# Patient Record
Sex: Male | Born: 1980 | Race: White | Hispanic: No | Marital: Married | Smoking: Never smoker
Health system: Southern US, Community
[De-identification: ages and names within clinical notes are randomized; demographics above are authoritative.]

## PROBLEM LIST (undated history)

## (undated) DIAGNOSIS — N433 Hydrocele, unspecified: Secondary | ICD-10-CM

## (undated) DIAGNOSIS — F4310 Post-traumatic stress disorder, unspecified: Secondary | ICD-10-CM

## (undated) DIAGNOSIS — Z6372 Alcoholism and drug addiction in family: Secondary | ICD-10-CM

## (undated) DIAGNOSIS — S5400XA Injury of ulnar nerve at forearm level, unspecified arm, initial encounter: Secondary | ICD-10-CM

## (undated) DIAGNOSIS — L409 Psoriasis, unspecified: Secondary | ICD-10-CM

## (undated) DIAGNOSIS — L4050 Arthropathic psoriasis, unspecified: Secondary | ICD-10-CM

## (undated) HISTORY — DX: Alcoholism and drug addiction in family: Z63.72

## (undated) HISTORY — DX: Post-traumatic stress disorder, unspecified: F43.10

## (undated) HISTORY — DX: Arthropathic psoriasis, unspecified: L40.50

## (undated) HISTORY — DX: Psoriasis, unspecified: L40.9

## (undated) HISTORY — PX: MOUTH SURGERY: SHX715

## (undated) HISTORY — DX: Hydrocele, unspecified: N43.3

---
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
## 2006-03-03 HISTORY — PX: OTHER SURGICAL HISTORY: SHX169

## 2006-07-04 ENCOUNTER — Inpatient Hospital Stay (HOSPITAL_COMMUNITY): Admission: EM | Admit: 2006-07-04 | Discharge: 2006-07-06 | Payer: Self-pay | Admitting: Emergency Medicine

## 2008-07-26 ENCOUNTER — Emergency Department (HOSPITAL_COMMUNITY): Admission: EM | Admit: 2008-07-26 | Discharge: 2008-07-26 | Payer: Self-pay | Admitting: Emergency Medicine

## 2008-07-26 ENCOUNTER — Encounter (INDEPENDENT_AMBULATORY_CARE_PROVIDER_SITE_OTHER): Payer: Self-pay | Admitting: *Deleted

## 2008-08-21 ENCOUNTER — Encounter: Payer: Self-pay | Admitting: Gastroenterology

## 2008-09-27 ENCOUNTER — Ambulatory Visit: Payer: Self-pay | Admitting: Gastroenterology

## 2010-04-03 DIAGNOSIS — N433 Hydrocele, unspecified: Secondary | ICD-10-CM

## 2010-04-03 HISTORY — DX: Hydrocele, unspecified: N43.3

## 2010-04-12 ENCOUNTER — Encounter: Payer: Self-pay | Admitting: Family Medicine

## 2010-04-12 ENCOUNTER — Ambulatory Visit (INDEPENDENT_AMBULATORY_CARE_PROVIDER_SITE_OTHER): Payer: 59 | Admitting: Family Medicine

## 2010-04-12 DIAGNOSIS — N508 Other specified disorders of male genital organs: Secondary | ICD-10-CM

## 2010-04-12 DIAGNOSIS — L408 Other psoriasis: Secondary | ICD-10-CM | POA: Insufficient documentation

## 2010-04-15 ENCOUNTER — Encounter: Payer: Self-pay | Admitting: Family Medicine

## 2010-04-15 ENCOUNTER — Ambulatory Visit: Payer: Self-pay | Admitting: Family Medicine

## 2010-04-24 NOTE — Assessment & Plan Note (Signed)
Summary: new pt to est [name] Simpson   Vital Signs:  Patient profile:   30 year old male Height:      75 inches Weight:      178.75 pounds BMI:     22.42 Temp:     97.7 degrees F oral Pulse rate:   76 / minute Pulse rhythm:   regular BP sitting:   122 / 68  (left arm) Cuff size:   regular  Vitals Entered By: Delilah Shan CMA Duncan Dull) (April 12, 2010 10:09 AM) CC: New Patient to Establish   History of Present Illness: New patient, to est care.    Found "BB sized" lesions on testicle.  1 on each side.  No FCNAVD.  No other complaints.  Area not tender to palpation.  Noted the spots about 2-3 years ago. Minimal change in size over that time period.  The spots are mobile per patient.  No h/o STDs.  No dysuria.    Preventive Screening-Counseling & Management  Caffeine-Diet-Exercise     Does Patient Exercise: yes  Allergies (verified): No Known Drug Allergies  Past History:  Family History: Last updated: 04/12/2010 Family History of Colon Cancer:paternal grandmother Family History of Colon Polyps:paternal grandmother Family History of Diabetes: maternal grandfather and paternal grandmother Family History of Heart Disease: maternal grandparents and Paternal grandparents Family History of Inflammatory Bowel Disease:Mother Family History of Alcoholism/Addiction, parents Family History High cholesterol, grandparents Family History Hypertension, parents, grandparents Family History of Stroke, grandparents F alive, etoh M alive, HTN 1 brother, healthy  Social History: Last updated: 04/12/2010 Education:  Associates Single nonsmoker Illicit Drug Use - no Alcohol use-yes, rare Regular exercise-yes Former Electronics engineer, 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  Past Medical History: Current Problems:  PSORIASIS (ICD-696.1) per  FAMILY HISTORY OF ALCOHOLISM/ADDICTION (ICD-V61.41)    Past Surgical History: oral surgery at age 12 FB removed from R arm 2008 (h/o  ulnar nerve effects in hand- numbness)  Family History: Family History of Colon Cancer:paternal grandmother Family History of Colon Polyps:paternal grandmother Family History of Diabetes: maternal grandfather and paternal grandmother Family History of Heart Disease: maternal grandparents and Paternal grandparents Family History of Inflammatory Bowel Disease:Mother Family History of Alcoholism/Addiction, parents Family History High cholesterol, grandparents Family History Hypertension, parents, grandparents Family History of Stroke, grandparents F alive, etoh M alive, HTN 1 brother, healthy  Social History: Education:  Associates Single nonsmoker Illicit Drug Use - no Alcohol use-yes, rare Regular exercise-yes Former Electronics engineer, 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  Otherwise negative.    Physical Exam  General:  GEN: nad, alert and oriented HEENT: mucous membranes moist NECK: supple w/o LA CV: rrr.  no murmur PULM: ctab, no inc wob ABD: soft, +bs EXT: no edema SKIN: no acute rash, mild psoriatic changes noted on the extensor side of elbow Genitalia:  Testes bilaterally descended without nodularity, tenderness or masses except for small (a few mm in size) mass on R testicle, near the epididymis. No scrotal masses or lesions. No penis lesions or urethral discharge.   Impression & Recommendations:  Problem # 1:  TESTICULAR MASS (ICD-608.89) d/w patient.  ?small R upper epididymial cyst.  refer for u/s.  normal transillumination and no LA.  He understood, agreed.  follow up as needed.  Orders: Radiology Referral (Radiology)  Complete Medication List: 1)  Olux 0.05 % Foam (Clobetasol propionate) .... Apply  to skin two times a day 2)  Multivitamins Tabs (Multiple vitamin) .... Take 1 tablet by mouth once a day  Patient Instructions: 1)  See Shirlee Limerick about your referral before your leave today.   We'll let you know about the report when I have results.  Take care. Glad to see you.    Orders Added: 1)  New Patient Level III [04540] 2)  Radiology Referral [Radiology]   Immunization History:  Tetanus/Td Immunization History:    Tetanus/Td:  historical (03/04/2007)  Influenza Immunization History:    Influenza:  historical (12/01/2009)   Immunization History:  Tetanus/Td Immunization History:    Tetanus/Td:  Historical (03/04/2007)  Influenza Immunization History:    Influenza:  Historical (12/01/2009)  Current Allergies (reviewed today): No known allergies      Appended Document: new pt to est [name] Simpson Copy faxed to [facility].  [phone]

## 2010-06-11 LAB — HEPATITIS PANEL, ACUTE
Hep A IgM: NEGATIVE
Hep B C IgM: NEGATIVE
Hepatitis B Surface Ag: NEGATIVE

## 2010-06-11 LAB — HIV ANTIBODY (ROUTINE TESTING W REFLEX): HIV: NONREACTIVE
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
## 2011-03-06 ENCOUNTER — Other Ambulatory Visit: Payer: Self-pay | Admitting: Family Medicine

## 2011-03-06 DIAGNOSIS — Z83438 Family history of other disorder of lipoprotein metabolism and other lipidemia: Secondary | ICD-10-CM

## 2011-03-06 DIAGNOSIS — Z833 Family history of diabetes mellitus: Secondary | ICD-10-CM

## 2011-03-10 ENCOUNTER — Other Ambulatory Visit (INDEPENDENT_AMBULATORY_CARE_PROVIDER_SITE_OTHER): Payer: 59

## 2011-03-10 DIAGNOSIS — Z833 Family history of diabetes mellitus: Secondary | ICD-10-CM

## 2011-03-10 DIAGNOSIS — Z8349 Family history of other endocrine, nutritional and metabolic diseases: Secondary | ICD-10-CM

## 2011-03-10 DIAGNOSIS — Z83438 Family history of other disorder of lipoprotein metabolism and other lipidemia: Secondary | ICD-10-CM

## 2011-03-10 LAB — GLUCOSE, RANDOM: Glucose, Bld: 111 mg/dL — ABNORMAL HIGH (ref 70–99)

## 2011-03-10 LAB — LIPID PANEL: VLDL: 61.2 mg/dL — ABNORMAL HIGH (ref 0.0–40.0)

## 2011-03-10 LAB — LDL CHOLESTEROL, DIRECT: Direct LDL: 125.6 mg/dL

## 2011-03-14 ENCOUNTER — Encounter: Payer: Self-pay | Admitting: Family Medicine

## 2011-03-17 ENCOUNTER — Ambulatory Visit (INDEPENDENT_AMBULATORY_CARE_PROVIDER_SITE_OTHER): Payer: 59 | Admitting: Family Medicine

## 2011-03-17 ENCOUNTER — Encounter: Payer: Self-pay | Admitting: Family Medicine

## 2011-03-17 VITALS — BP 128/76 | HR 68 | Temp 98.1°F | Ht 73.5 in | Wt 179.2 lb

## 2011-03-17 DIAGNOSIS — Z0000 Encounter for general adult medical examination without abnormal findings: Secondary | ICD-10-CM

## 2011-03-17 DIAGNOSIS — R5383 Other fatigue: Secondary | ICD-10-CM

## 2011-03-17 DIAGNOSIS — R6889 Other general symptoms and signs: Secondary | ICD-10-CM

## 2011-03-17 DIAGNOSIS — R899 Unspecified abnormal finding in specimens from other organs, systems and tissues: Secondary | ICD-10-CM

## 2011-03-17 DIAGNOSIS — R5381 Other malaise: Secondary | ICD-10-CM

## 2011-03-17 NOTE — Patient Instructions (Addendum)
I would get a flu shot (not the flu mist) each fall.   Come back for fasting labs.  You can get your results through our phone system.  Follow the instructions on the blue card. Take care.  Glad to see you.

## 2011-03-17 NOTE — Progress Notes (Signed)
CPE- See plan.  Routine anticipatory guidance given to patient.  See health maintenance.  H/o small L hydrocele on the L testicle, prev u/s done, no sx currently.    Labs d/w pt and he isn't sure if he was fasting.  Will repeat.  See notes on labs.    Sex drive is decreased.  He has some dec in motivation (generally) recently.  He's working out, but not having as much motivation to work out.  Minimal alcohol. No illicits.  Mood okay, but slightly flat.  No SI/HI.  Going on for months.  He isn't sure what is going on with his symptoms.  No sig lifestyle changes.  Work is good for patient.  Fatigue.    PMH and SH reviewed  Meds, vitals, and allergies reviewed.   ROS: See HPI.  Otherwise negative.    GEN: nad, alert and oriented HEENT: mucous membranes moist NECK: supple w/o LA CV: rrr. PULM: ctab, no inc wob ABD: soft, +bs EXT: no edema SKIN: no acute rash

## 2011-03-18 ENCOUNTER — Other Ambulatory Visit (INDEPENDENT_AMBULATORY_CARE_PROVIDER_SITE_OTHER): Payer: 59

## 2011-03-18 DIAGNOSIS — R6889 Other general symptoms and signs: Secondary | ICD-10-CM

## 2011-03-18 DIAGNOSIS — R5383 Other fatigue: Secondary | ICD-10-CM

## 2011-03-18 DIAGNOSIS — R5381 Other malaise: Secondary | ICD-10-CM

## 2011-03-18 DIAGNOSIS — R899 Unspecified abnormal finding in specimens from other organs, systems and tissues: Secondary | ICD-10-CM

## 2011-03-18 LAB — CBC WITH DIFFERENTIAL/PLATELET
Basophils Relative: 0.3 % (ref 0.0–3.0)
Eosinophils Relative: 1.8 % (ref 0.0–5.0)
Hemoglobin: 15.4 g/dL (ref 13.0–17.0)
Lymphocytes Relative: 33.6 % (ref 12.0–46.0)
Monocytes Relative: 7.1 % (ref 3.0–12.0)
Neutro Abs: 3.2 10*3/uL (ref 1.4–7.7)
Neutrophils Relative %: 57.2 % (ref 43.0–77.0)
RBC: 4.78 Mil/uL (ref 4.22–5.81)
WBC: 5.5 10*3/uL (ref 4.5–10.5)

## 2011-03-18 LAB — LIPID PANEL
Cholesterol: 230 mg/dL — ABNORMAL HIGH (ref 0–200)
VLDL: 35.8 mg/dL (ref 0.0–40.0)

## 2011-03-18 LAB — COMPREHENSIVE METABOLIC PANEL
AST: 32 U/L (ref 0–37)
BUN: 18 mg/dL (ref 6–23)
Calcium: 9.1 mg/dL (ref 8.4–10.5)
Chloride: 103 mEq/L (ref 96–112)
Creatinine, Ser: 1 mg/dL (ref 0.4–1.5)
GFR: 100.13 mL/min (ref >60.00)
Glucose, Bld: 93 mg/dL (ref 70–99)

## 2011-03-18 LAB — LDL CHOLESTEROL, DIRECT: Direct LDL: 156.6 mg/dL

## 2011-03-20 ENCOUNTER — Telehealth: Payer: Self-pay | Admitting: Family Medicine

## 2011-03-20 DIAGNOSIS — E291 Testicular hypofunction: Secondary | ICD-10-CM

## 2011-03-20 LAB — TESTOSTERONE: Testosterone: 209.25 ng/dL — ABNORMAL LOW (ref 350.00–890.00)

## 2011-03-20 NOTE — Telephone Encounter (Signed)
Please call pt.  All labs are okay except for low T. I am not sure if this is related to the fatigue, but it would be worth talking to uro about this.  I put in the referral.  Thanks.

## 2011-03-21 ENCOUNTER — Encounter: Payer: Self-pay | Admitting: Family Medicine

## 2011-03-21 DIAGNOSIS — R5383 Other fatigue: Secondary | ICD-10-CM | POA: Insufficient documentation

## 2011-03-21 DIAGNOSIS — Z0000 Encounter for general adult medical examination without abnormal findings: Secondary | ICD-10-CM | POA: Insufficient documentation

## 2011-03-21 NOTE — Assessment & Plan Note (Signed)
Flu shot encouraged, healthy habits encouraged.  See notes on repeat labs.

## 2011-03-21 NOTE — Assessment & Plan Note (Signed)
With dec in sex drive.  Check labs, see notes on labs.

## 2011-03-21 NOTE — Telephone Encounter (Signed)
Patient advised.   Prefers a Insurance underwriter in [place].

## 2011-04-30 ENCOUNTER — Encounter: Payer: Self-pay | Admitting: Family Medicine

## 2011-04-30 DIAGNOSIS — E291 Testicular hypofunction: Secondary | ICD-10-CM | POA: Insufficient documentation

## 2011-09-16 ENCOUNTER — Emergency Department (HOSPITAL_COMMUNITY): Payer: Worker's Compensation

## 2011-09-16 ENCOUNTER — Encounter (HOSPITAL_COMMUNITY): Payer: Self-pay | Admitting: *Deleted

## 2011-09-16 ENCOUNTER — Emergency Department (HOSPITAL_COMMUNITY)
Admission: EM | Admit: 2011-09-16 | Discharge: 2011-09-16 | Disposition: A | Discharge status: Home / Self Care | Payer: Worker's Compensation | Attending: Emergency Medicine | Admitting: Emergency Medicine

## 2011-09-16 DIAGNOSIS — S99919A Unspecified injury of unspecified ankle, initial encounter: Secondary | ICD-10-CM | POA: Insufficient documentation

## 2011-09-16 DIAGNOSIS — Y990 Civilian activity done for income or pay: Secondary | ICD-10-CM | POA: Insufficient documentation

## 2011-09-16 DIAGNOSIS — S8980XA Other specified injuries of unspecified lower leg, initial encounter: Secondary | ICD-10-CM

## 2011-09-16 DIAGNOSIS — S8990XA Unspecified injury of unspecified lower leg, initial encounter: Secondary | ICD-10-CM | POA: Insufficient documentation

## 2011-09-16 DIAGNOSIS — X500XXA Overexertion from strenuous movement or load, initial encounter: Secondary | ICD-10-CM | POA: Insufficient documentation

## 2011-09-16 DIAGNOSIS — M25569 Pain in unspecified knee: Secondary | ICD-10-CM | POA: Insufficient documentation

## 2011-09-16 MED ORDER — HYDROCODONE-ACETAMINOPHEN 5-500 MG PO TABS: 1.0000 | ORAL_TABLET | Freq: Four times a day (QID) | ORAL | Status: AC | PRN
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
## 2011-09-16 NOTE — ED Notes (Signed)
Pt states he was walking down steps and felt right knee "hyper-extend" pt states he braced himself and did not fall. Pt reports increased pain with weight-bearing. No obvious deformities noted.

## 2011-09-17 NOTE — ED Provider Notes (Signed)
Medical screening examination/treatment/procedure(s) were performed by non-physician practitioner and as supervising physician I was immediately available for consultation/collaboration.   Hanley Seamen, MD 09/17/11 [phone]

## 2012-01-16 ENCOUNTER — Encounter: Payer: Self-pay | Admitting: Family Medicine

## 2012-01-16 ENCOUNTER — Ambulatory Visit (INDEPENDENT_AMBULATORY_CARE_PROVIDER_SITE_OTHER): Payer: Worker's Compensation | Admitting: Family Medicine

## 2012-01-16 VITALS — BP 118/68 | HR 76 | Temp 98.3°F | Ht 74.0 in | Wt 188.0 lb

## 2012-01-16 DIAGNOSIS — M25579 Pain in unspecified ankle and joints of unspecified foot: Secondary | ICD-10-CM

## 2012-01-16 NOTE — Patient Instructions (Addendum)
Keep using range of motion exercises for your ankle.  Take care.
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
## 2012-01-17 DIAGNOSIS — M25579 Pain in unspecified ankle and joints of unspecified foot: Secondary | ICD-10-CM | POA: Insufficient documentation

## 2012-01-17 NOTE — Assessment & Plan Note (Signed)
Chronic, episodically worse.  He has likely compensated for the prev knee injury.  It is as likely as not that Mr. [name] Simpson's right knee condition and/or his year of parachute jumps causes or contributes to his right ankle condition.  It would be reasonable for him to work on ROM exercises.  He needed a letter about his exam and this was provided.  He'll drop off a copy of his [facility] medical records.

## 2012-04-18 ENCOUNTER — Emergency Department (HOSPITAL_COMMUNITY)
Admission: EM | Admit: 2012-04-18 | Discharge: 2012-04-18 | Disposition: A | Discharge status: Home / Self Care | Payer: Worker's Compensation | Attending: Emergency Medicine | Admitting: Emergency Medicine

## 2012-04-18 ENCOUNTER — Emergency Department (HOSPITAL_COMMUNITY): Payer: Worker's Compensation

## 2012-04-18 ENCOUNTER — Encounter (HOSPITAL_COMMUNITY): Payer: Self-pay

## 2012-04-18 DIAGNOSIS — L210 Seborrhea capitis: Secondary | ICD-10-CM | POA: Insufficient documentation

## 2012-04-18 DIAGNOSIS — Y9229 Other specified public building as the place of occurrence of the external cause: Secondary | ICD-10-CM | POA: Insufficient documentation

## 2012-04-18 DIAGNOSIS — IMO0002 Reserved for concepts with insufficient information to code with codable children: Secondary | ICD-10-CM | POA: Insufficient documentation

## 2012-04-18 DIAGNOSIS — W19XXXA Unspecified fall, initial encounter: Secondary | ICD-10-CM | POA: Insufficient documentation

## 2012-04-18 DIAGNOSIS — S80219A Abrasion, unspecified knee, initial encounter: Secondary | ICD-10-CM

## 2012-04-18 DIAGNOSIS — S5400XA Injury of ulnar nerve at forearm level, unspecified arm, initial encounter: Secondary | ICD-10-CM | POA: Insufficient documentation

## 2012-04-18 DIAGNOSIS — Z79899 Other long term (current) drug therapy: Secondary | ICD-10-CM | POA: Insufficient documentation

## 2012-04-18 DIAGNOSIS — Y990 Civilian activity done for income or pay: Secondary | ICD-10-CM | POA: Insufficient documentation

## 2012-04-18 DIAGNOSIS — S8000XA Contusion of unspecified knee, initial encounter: Secondary | ICD-10-CM | POA: Insufficient documentation

## 2012-04-18 DIAGNOSIS — Z87448 Personal history of other diseases of urinary system: Secondary | ICD-10-CM | POA: Insufficient documentation

## 2012-04-18 HISTORY — DX: Injury of ulnar nerve at forearm level, unspecified arm, initial encounter: S54.00XA

## 2012-04-18 MED ORDER — IBUPROFEN 800 MG PO TABS
800.0000 mg | ORAL_TABLET | Freq: Once | ORAL | Status: AC
  Administered 2012-04-18: 800 mg via ORAL
  Filled 2012-04-18: qty 1

## 2012-04-18 NOTE — ED Provider Notes (Signed)
Medical screening examination/treatment/procedure(s) were performed by non-physician practitioner and as supervising physician I was immediately available for consultation/collaboration.  Annalycia Done M Halvor Behrend, MD 04/18/12 0431 

## 2012-04-18 NOTE — ED Notes (Signed)
Patient transported to X-ray 
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
## 2014-01-23 ENCOUNTER — Encounter: Payer: Self-pay | Admitting: Family Medicine

## 2014-01-23 ENCOUNTER — Ambulatory Visit (INDEPENDENT_AMBULATORY_CARE_PROVIDER_SITE_OTHER): Payer: 59 | Admitting: Family Medicine

## 2014-01-23 VITALS — BP 118/78 | HR 64 | Temp 98.5°F | Wt 182.0 lb

## 2014-01-23 DIAGNOSIS — M542 Cervicalgia: Secondary | ICD-10-CM

## 2014-01-23 MED ORDER — CYCLOBENZAPRINE HCL 10 MG PO TABS: 5.0000 mg | ORAL_TABLET | Freq: Three times a day (TID) | ORAL | Status: DC | PRN

## 2014-01-23 NOTE — Progress Notes (Signed)
Pre visit review using our clinic review tool, if applicable. No additional management support is needed unless otherwise documented below in the visit note.  Neck pain.  "I tend to pop my neck but I stopped doing that."  R sided posterior neck pain.  Not midline or L sided pain.  Causing HA, R sided occipital.   No rash.  No specific trauma.  No arm sx.  No dysphagia.  Going on for about 3 months, noted most days, some days better than others.   More pain with neck ROM, tilting or turning head to the right.   Tried ibuprofen with a little relief, no fully resolved.   Not a stiff neck.    Meds, vitals, and allergies reviewed.   ROS: See HPI.  Otherwise, noncontributory.  nad ncat Neck supple, no LA No midline pain, slightly ttp/tight on the R paraspinal muscles No rash rrr ctab Normal S/S ext x4

## 2014-01-23 NOTE — Patient Instructions (Signed)
Heat, ibuprofen with food, flexeril (sedation caution) and notify me if not better.  Take care.  Glad to see you.

## 2014-01-25 DIAGNOSIS — M542 Cervicalgia: Secondary | ICD-10-CM | POA: Insufficient documentation

## 2014-01-25 NOTE — Assessment & Plan Note (Signed)
Likely muscle tightness/spasm.  No need to image today.  Flexeril with sedation caution.  Heat, stretching, nsaid cautions.  He agrees f/u prn.  Also he had flu mist about 1 month ago.  Chart updated.

## 2015-07-02 ENCOUNTER — Encounter: Payer: Self-pay | Admitting: Family Medicine

## 2015-07-02 ENCOUNTER — Ambulatory Visit (INDEPENDENT_AMBULATORY_CARE_PROVIDER_SITE_OTHER)
Admission: RE | Admit: 2015-07-02 | Discharge: 2015-07-02 | Disposition: A | Discharge status: Home / Self Care | Payer: 59 | Source: Ambulatory Visit | Attending: Family Medicine | Admitting: Family Medicine

## 2015-07-02 ENCOUNTER — Ambulatory Visit (INDEPENDENT_AMBULATORY_CARE_PROVIDER_SITE_OTHER): Payer: 59 | Admitting: Family Medicine

## 2015-07-02 VITALS — BP 112/74 | HR 81 | Temp 98.5°F | Wt 183.0 lb

## 2015-07-02 DIAGNOSIS — M546 Pain in thoracic spine: Secondary | ICD-10-CM | POA: Diagnosis not present

## 2015-07-02 DIAGNOSIS — L4050 Arthropathic psoriasis, unspecified: Secondary | ICD-10-CM | POA: Diagnosis not present

## 2015-07-02 DIAGNOSIS — R197 Diarrhea, unspecified: Secondary | ICD-10-CM | POA: Diagnosis not present

## 2015-07-02 DIAGNOSIS — M549 Dorsalgia, unspecified: Secondary | ICD-10-CM | POA: Insufficient documentation

## 2015-07-02 MED ORDER — CYCLOBENZAPRINE HCL 10 MG PO TABS: 5.0000 mg | ORAL_TABLET | Freq: Three times a day (TID) | ORAL | Status: DC | PRN

## 2015-07-02 NOTE — Assessment & Plan Note (Signed)
No radicular sx, pred didn't help.  Check basic films today and use flexeril to see if that help in the meantime, as this could be chronic muscle spasm.  D/w pt.  He agrees.  Sedation caution.

## 2015-07-02 NOTE — Assessment & Plan Note (Signed)
dw pt.  Sounds more typical for IBS.  Refer to GI.  He agrees. >25 minutes spent in face to face time with patient, >50% spent in counselling or coordination of care.
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
## 2015-07-02 NOTE — Patient Instructions (Signed)
[name] will call about your referral.  See her on the way out.  Go to the lab on the way out.  We'll contact you with your lab report. Don't change your meds for now.

## 2016-01-26 ENCOUNTER — Emergency Department (HOSPITAL_COMMUNITY): Payer: 59

## 2016-01-26 ENCOUNTER — Encounter (HOSPITAL_COMMUNITY): Payer: Self-pay | Admitting: Family Medicine

## 2016-01-26 ENCOUNTER — Emergency Department (HOSPITAL_COMMUNITY)
Admission: EM | Admit: 2016-01-26 | Discharge: 2016-01-26 | Disposition: A | Discharge status: Home / Self Care | Payer: 59 | Attending: Emergency Medicine | Admitting: Emergency Medicine

## 2016-01-26 DIAGNOSIS — R202 Paresthesia of skin: Secondary | ICD-10-CM | POA: Diagnosis not present

## 2016-01-26 DIAGNOSIS — R791 Abnormal coagulation profile: Secondary | ICD-10-CM | POA: Insufficient documentation

## 2016-01-26 DIAGNOSIS — R200 Anesthesia of skin: Secondary | ICD-10-CM | POA: Diagnosis not present

## 2016-01-26 DIAGNOSIS — R29810 Facial weakness: Secondary | ICD-10-CM | POA: Diagnosis present

## 2016-01-26 DIAGNOSIS — Z79899 Other long term (current) drug therapy: Secondary | ICD-10-CM | POA: Insufficient documentation

## 2016-01-26 LAB — PROTIME-INR
INR: 0.93
PROTHROMBIN TIME: 12.4 s (ref 11.4–15.2)

## 2016-01-26 LAB — DIFFERENTIAL
Basophils Absolute: 0 10*3/uL (ref 0.0–0.1)
Basophils Relative: 0 %
EOS ABS: 0.1 10*3/uL (ref 0.0–0.7)
EOS PCT: 2 %
LYMPHS PCT: 38 %
Lymphs Abs: 2.3 10*3/uL (ref 0.7–4.0)
MONO ABS: 0.6 10*3/uL (ref 0.1–1.0)
Monocytes Relative: 10 %
NEUTROS PCT: 50 %
Neutro Abs: 3.2 10*3/uL (ref 1.7–7.7)

## 2016-01-26 LAB — CBC
HCT: 43.1 % (ref 39.0–52.0)
Hemoglobin: 15 g/dL (ref 13.0–17.0)
MCH: 30.8 pg (ref 26.0–34.0)
MCHC: 34.8 g/dL (ref 30.0–36.0)
MCV: 88.5 fL (ref 78.0–100.0)
Platelets: 165 K/uL (ref 150–400)
RBC: 4.87 MIL/uL (ref 4.22–5.81)
RDW: 12.1 % (ref 11.5–15.5)
WBC: 6.2 K/uL (ref 4.0–10.5)

## 2016-01-26 LAB — COMPREHENSIVE METABOLIC PANEL
ALBUMIN: 4.8 g/dL (ref 3.5–5.0)
ALK PHOS: 61 U/L (ref 38–126)
ALT: 33 U/L (ref 17–63)
ANION GAP: 9 (ref 5–15)
AST: 29 U/L (ref 15–41)
BILIRUBIN TOTAL: 0.9 mg/dL (ref 0.3–1.2)
BUN: 17 mg/dL (ref 6–20)
CALCIUM: 9.4 mg/dL (ref 8.9–10.3)
CO2: 25 mmol/L (ref 22–32)
Chloride: 105 mmol/L (ref 101–111)
Creatinine, Ser: 0.97 mg/dL (ref 0.61–1.24)
GFR calc non Af Amer: >60 mL/min (ref >60)
GLUCOSE: 91 mg/dL (ref 65–99)
POTASSIUM: 4 mmol/L (ref 3.5–5.1)
SODIUM: 139 mmol/L (ref 135–145)
TOTAL PROTEIN: 8 g/dL (ref 6.5–8.1)

## 2016-01-26 LAB — I-STAT CHEM 8, ED
BUN: 19 mg/dL (ref 6–20)
Calcium, Ion: 1.18 mmol/L (ref 1.15–1.40)
Chloride: 104 mmol/L (ref 101–111)
Creatinine, Ser: 1 mg/dL (ref 0.61–1.24)
Glucose, Bld: 93 mg/dL (ref 65–99)
HCT: 46 % (ref 39.0–52.0)
Hemoglobin: 15.6 g/dL (ref 13.0–17.0)
Potassium: 4.2 mmol/L (ref 3.5–5.1)
Sodium: 140 mmol/L (ref 135–145)
TCO2: 27 mmol/L (ref 0–100)

## 2016-01-26 LAB — I-STAT TROPONIN, ED: Troponin i, poc: 0 ng/mL (ref 0.00–0.08)

## 2016-01-26 LAB — APTT: aPTT: 28 seconds (ref 24–36)

## 2016-01-26 MED ORDER — GADOBENATE DIMEGLUMINE 529 MG/ML IV SOLN
17.0000 mL | Freq: Once | INTRAVENOUS | Status: AC | PRN
  Administered 2016-01-26: 17 mL via INTRAVENOUS

## 2016-01-26 NOTE — Consult Note (Addendum)
Neurology Consultation Reason for Consult: transient left sided paresthesia Referring Physician: Alric RanGoldston, S  CC: transient left sided paresthesia  History is obtained from: patient, wife.   HPI: [name] Simpson is a 35 y.o. male Presents following an episode that occurred around 4:30 PM.His wife reports that he approached her and tried to say "help me" But he reports that the words just would not come out he felt numbness and tingling of his left arm and face. He felt like he may have been slightly confused during this episode. Following this, the symptoms gradually improved to the point that he is now nearing baseline.   He also complains of some dizziness which started last night, but was relatively mild. This morning the dizziness was present and persistent. This has lasted throughout the day and is still present.   He does not have any severe headache, but does state that the backs of his eyes feel "sore" bilaterally and he does note that light is bothering him some.    He does occasionally get headaches, But not definitely migrainous in  [place]. He does have a brother who gets migraines.  LKW: 11/24 prior to bed tpa given?: no, Out of window    ROS: A 14 point ROS was performed and is negative except as noted in the HPI.   Past Medical History:  Diagnosis Date  . Alcoholism in family   . Hydrocele, left 04/2010   Seen on testicular US, small  . Psoriasis    per Dr. Terri PiedraLupton  . Psoriatic arthritis (HCC)    per Dr. Kellie Simmeringruslow  . Ulnar nerve injury      Family History  Problem Relation Age of Onset  . Irritable bowel syndrome Mother   . Hypertension Mother   . Alcohol abuse Father   . Cirrhosis Father   . Cancer Paternal Grandmother     colon CA, colon polyps  . Diabetes Paternal Grandmother   . Diabetes Maternal Grandfather   . Alcohol abuse Other   . Hyperlipidemia Other   . Hypertension Other   . Heart disease Other   . Hypertension Other   . Stroke Other   .  Prostate cancer Neg Hx   Brother-migraine   Social History:  reports that he has never smoked. He has never used smokeless tobacco. He reports that he drinks alcohol. He reports that he does not use drugs.   Exam: Current vital signs: BP 128/79   Pulse 79   Temp 98 F (36.7 C) (Oral)   Resp 17   Ht 6\' 3"  (1.905 m)   Wt 83.9 kg (185 lb)   SpO2 99%   BMI 23.12 kg/m  Vital signs in last 24 hours: Temp:  [98 F (36.7 C)] 98 F (36.7 C) (11/25 1740) Pulse Rate:  [74-86] 79 (11/25 1900) Resp:  [17-20] 17 (11/25 1900) BP: (128-147)/(79-93) 128/79 (11/25 1900) SpO2:  [98 %-99 %] 99 % (11/25 1900) Weight:  [83.9 kg (185 lb)] 83.9 kg (185 lb) (11/25 1742)   Physical Exam  Constitutional: Appears well-developed and well-nourished.  Psych: Affect appropriate to situation Eyes: No scleral injection HENT: No OP obstrucion Head: Normocephalic.  Cardiovascular: Normal rate and regular rhythm.  Respiratory: Effort normal and breath sounds normal to anterior ascultation GI: Soft.  No distension. There is no tenderness.  Skin: WDI  Neuro: Mental Status: Patient is awake, alert, oriented to person, place, month, year, and situation. Patient is able to give a clear and coherent history. No signs  of aphasia or neglect Cranial Nerves: II: Visual Fields are full. Pupils are equal, round, and reactive to light.   III,IV, VI: EOMI without ptosis or diploplia.  V: Facial sensation is symmetric to temperature VII: Facial movement is symmetric.  VIII: hearing is intact to voice X: Uvula elevates symmetrically XI: Shoulder shrug is symmetric. XII: tongue is midline without atrophy or fasciculations.  Motor: Tone is normal. Bulk is normal. 5/5 strength was present in all four extremities.  Sensory: Sensation is symmetric to light touch and temperature in the arms and legs. Deep Tendon Reflexes: 2+ and symmetric in the biceps and patellae.  Plantars: Toes are downgoing bilaterally.   Cerebellar: FNF intact bilaterally  I have reviewed labs in epic and the results pertinent to this consultation are: CBC, BMP-unremarkable  I have reviewed the images obtained:CT head-negative  Impression: 35 year old male with transient left-sided paresthesias, difficulty speaking in the setting of mild retro-orbital discomfort and photophobia. Given the nature of his deficits, I do think that A vascular etiology needs to be ruled out with MRI. With persistent symptoms since this AM, I would think that MRI would likely be positive if this was a vascular cause. Also, with positive symptoms(paresthesia) , this is more common with migraine aura than ischemia. That being siad, he does not have a definite history of migraine.   Therefore, I would further investigate with MRI/MRA. If there is abnormality on either that could be related to symptoms then I would suggest pursuing that.   Recommendations: 1) MRI brain, MRA head and neck.  2) If her were to have recurrent symptoms, I would have him return to the ER  Ritta SlotMcNeill Lawton Dollinger, MD [facility] [phone]  If 7pm- 7am, please page neurology on call as listed in AMION.

## 2016-01-26 NOTE — ED Notes (Signed)
Pt remains in MRI with Riki RuskJeremy, NT.

## 2016-01-26 NOTE — ED Notes (Signed)
Pt in MRI with NT.

## 2016-01-26 NOTE — ED Notes (Signed)
ED Provider at bedside. EDP [place] 

## 2016-01-26 NOTE — ED Triage Notes (Signed)
Patient started experiencing dizziness that started 20:00 last night. THen, at 16:50 today, he developed left sided facial droop and altered mental status. Pt reports he wanted to ask his spouse for assistance but physically could not speak the words. No improvement of dizziness. Slight improvement with facial droop but pt still struggles with talking. Feels like his tongue is thick.

## 2016-01-26 NOTE — ED Provider Notes (Signed)
[facility]-EMERGENCY DEPT Provider Note   CSN: [id number] Arrival date & time: 01/26/16  1721   An emergency department physician performed an initial assessment on this suspected stroke patient at 1750.  History   Chief Complaint Chief Complaint  Patient presents with  . Facial Droop  . Altered Mental Status    HPI [name] Simpson is a 35 y.o. male.  HPI  35 year old male presents with acute left facial numbness, droop, and slurred speech. History taken from patient and significant other. Last night at around 10 PM he started feeling dizzy with blurred vision. He describes this as a smudged vision. It comes and goes. He's not really dizzy as much as he is off balance. No feeling like he's going to pass out. On and off since last night into today. At around 4:45 PM he all of a sudden had trouble speaking and could not get his words out. His tongue felt numb. His left face was drooping. Brought in here and his symptoms are significantly improved but not gone. He also has felt left arm numbness and tingling since then. Denies headache, neck pain. His chest felt tight when he had the slurred speech but he thinks that was anxiety and has resolved.  Past Medical History:  Diagnosis Date  . Alcoholism in family   . Hydrocele, left 04/2010   Seen on testicular US, small  . Psoriasis    per Dr. Terri PiedraLupton  . Psoriatic arthritis (HCC)    per Dr. Kellie Simmeringruslow  . Ulnar nerve injury     Patient Active Problem List   Diagnosis Date Noted  . Back pain 07/02/2015  . Psoriatic arthritis (HCC) 07/02/2015  . Diarrhea 07/02/2015  . Neck pain 01/25/2014  . Ankle pain 01/17/2012  . Hypogonadism male 04/30/2011  . Routine general medical examination at a health care facility 03/21/2011  . Fatigue 03/21/2011  . PSORIASIS 04/12/2010    Past Surgical History:  Procedure Laterality Date  . FB removed, right arm  2008   h/o ulnar nerve effects in hand - numbness  . MOUTH SURGERY  Age 35       Home  Medications    Prior to Admission medications   Medication Sig Start Date End Date Taking? Authorizing Provider  saccharomyces boulardii (FLORASTOR) 250 MG capsule Take 250 mg by mouth 2 (two) times daily.   Yes Historical Provider, MD    Family History Family History  Problem Relation Age of Onset  . Irritable bowel syndrome Mother   . Hypertension Mother   . Alcohol abuse Father   . Cirrhosis Father   . Cancer Paternal Grandmother     colon CA, colon polyps  . Diabetes Paternal Grandmother   . Diabetes Maternal Grandfather   . Alcohol abuse Other   . Hyperlipidemia Other   . Hypertension Other   . Heart disease Other   . Hypertension Other   . Stroke Other   . Prostate cancer Neg Hx     Social History Social History  Substance Use Topics  . Smoking status: Never Smoker  . Smokeless tobacco: Never Used  . Alcohol use 0.0 oz/week     Comment: Once every 3 months     Allergies   Patient has no known allergies.   Review of Systems Review of Systems  Constitutional: Negative for fever.  Eyes: Positive for visual disturbance. Negative for photophobia and pain.  Respiratory: Positive for chest tightness. Negative for shortness of breath.   Gastrointestinal: Negative for nausea  and vomiting.  Musculoskeletal: Negative for neck pain and neck stiffness.  Neurological: Positive for dizziness, speech difficulty, weakness (facial) and numbness (left arm). Negative for headaches.  All other systems reviewed and are negative.    Physical Exam Updated Vital Signs BP 124/79 (BP Location: Left Arm)   Pulse 83   Temp 98 F (36.7 C) (Oral)   Resp 18   Ht 6\' 3"  (1.905 m)   Wt 185 lb (83.9 kg)   SpO2 98%   BMI 23.12 kg/m   Physical Exam  Constitutional: He is oriented to person, place, and time. He appears well-developed and well-nourished.  HENT:  Head: Normocephalic and atraumatic.  Right Ear: External ear normal.  Left Ear: External ear normal.  Nose: Nose  normal.  Eyes: Right eye exhibits no discharge. Left eye exhibits no discharge.  Neck: Neck supple.  Cardiovascular: Normal rate, regular rhythm and normal heart sounds.   Pulmonary/Chest: Effort normal and breath sounds normal.  Abdominal: Soft. There is no tenderness.  Musculoskeletal: He exhibits no edema.  Neurological: He is alert and oriented to person, place, and time.  CN 3-12 grossly intact. 5/5 strength in all 4 extremities. Grossly normal sensation except for slightly decreased sensation diffusely in left arm. Normal finger to nose.   Skin: Skin is warm and dry.  Nursing note and vitals reviewed.    ED Treatments / Results  Labs (all labs ordered are listed, but only abnormal results are displayed) Labs Reviewed  PROTIME-INR  APTT  CBC  DIFFERENTIAL  COMPREHENSIVE METABOLIC PANEL  I-STAT TROPOININ, ED  CBG MONITORING, ED  I-STAT CHEM 8, ED    EKG  EKG Interpretation  Date/Time:  Saturday January 26 2016 17:39:25 EST Ventricular Rate:  72 PR Interval:    QRS Duration: 86 QT Interval:  358 QTC Calculation: 392 R Axis:   88 Text Interpretation:  Sinus rhythm RSR' in V1 or V2, right VCD or RVH no acute ST/T changes No old tracing to compare Confirmed by Chakara Bognar MD, Zaevion Parke [phone]) on 01/26/2016 10:00:23 PM       Radiology Mr Maxine Glenn Head Wo Contrast  Result Date: 01/26/2016 CLINICAL DATA:  Acute left-sided weakness EXAM: MRI HEAD WITHOUT CONTRAST MRA HEAD WITHOUT CONTRAST MRA NECK WITH AND WITHOUT CONTRAST TECHNIQUE: Multiplanar, multiecho pulse sequences of the brain and surrounding structures were obtained without intravenous contrast. Angiographic images of the Circle of Willis were obtained using MRA technique without intravenous contrast. Angiographic images of the neck were obtained using MRA technique with and without intravenous contrast. Carotid stenosis measurements (when applicable) are obtained utilizing NASCET criteria, using the distal internal carotid  diameter as the denominator. COMPARISON:  None. FINDINGS: MRI HEAD FINDINGS Brain: No acute infarct or intraparenchymal hemorrhage. The midline structures are normal. No focal parenchymal signal abnormality. No mass lesion or midline shift. No hydrocephalus or extra-axial fluid collection. No age advanced or lobar predominant atrophy. Vascular: Major intracranial arterial and venous sinus flow voids are preserved. No evidence of chronic microhemorrhage or amyloid angiopathy. Skull and upper cervical spine: The visualized skull base, calvarium, upper cervical spine and extracranial soft tissues are normal. Sinuses/Orbits: No fluid levels or advanced mucosal thickening. No mastoid effusion. Normal orbits. MRA HEAD FINDINGS Intracranial internal carotid arteries: There is a small outpouching from the lateral aspect of the cavernous segment of the left internal carotid artery, measuring 2 mm base to apex and 2 mm at its base. Anterior cerebral arteries: Normal. Middle cerebral arteries: Normal. Posterior communicating arteries: Absent bilaterally.  Posterior cerebral arteries: Normal. Basilar artery: Normal. Vertebral arteries: Left dominant. Normal. Superior cerebellar arteries: Normal. Anterior inferior cerebellar arteries: Normal. Posterior inferior cerebellar arteries: Normal. MRA NECK FINDINGS Aortic arch: There is a normal 3 vessel branching pattern. The proximal subclavian arteries are normal. No aneurysm or dissection. Right carotid system:  Normal Left carotid system:  Normal Vertebral arteries: Both vertebral artery origins are widely patent. The cervical segments of both vertebral arteries are normal to their confluence at the basilar artery. Visualization of the neck is otherwise unremarkable. IMPRESSION: 1. No acute intracranial abnormality. 2. No occlusion or high-grade stenosis of the intracranial arteries. 3. Normal MRA of the neck. 4. 2 mm outpouching from the cavernous segment of the left internal  carotid artery, small aneurysm versus vascular infundibulum. Electronically Signed   By: Deatra Robinson M.D.   On: 01/26/2016 21:51   Mr Maxine Glenn Neck W Wo Contrast  Result Date: 01/26/2016 CLINICAL DATA:  Acute left-sided weakness EXAM: MRI HEAD WITHOUT CONTRAST MRA HEAD WITHOUT CONTRAST MRA NECK WITH AND WITHOUT CONTRAST TECHNIQUE: Multiplanar, multiecho pulse sequences of the brain and surrounding structures were obtained without intravenous contrast. Angiographic images of the Circle of Willis were obtained using MRA technique without intravenous contrast. Angiographic images of the neck were obtained using MRA technique with and without intravenous contrast. Carotid stenosis measurements (when applicable) are obtained utilizing NASCET criteria, using the distal internal carotid diameter as the denominator. COMPARISON:  None. FINDINGS: MRI HEAD FINDINGS Brain: No acute infarct or intraparenchymal hemorrhage. The midline structures are normal. No focal parenchymal signal abnormality. No mass lesion or midline shift. No hydrocephalus or extra-axial fluid collection. No age advanced or lobar predominant atrophy. Vascular: Major intracranial arterial and venous sinus flow voids are preserved. No evidence of chronic microhemorrhage or amyloid angiopathy. Skull and upper cervical spine: The visualized skull base, calvarium, upper cervical spine and extracranial soft tissues are normal. Sinuses/Orbits: No fluid levels or advanced mucosal thickening. No mastoid effusion. Normal orbits. MRA HEAD FINDINGS Intracranial internal carotid arteries: There is a small outpouching from the lateral aspect of the cavernous segment of the left internal carotid artery, measuring 2 mm base to apex and 2 mm at its base. Anterior cerebral arteries: Normal. Middle cerebral arteries: Normal. Posterior communicating arteries: Absent bilaterally. Posterior cerebral arteries: Normal. Basilar artery: Normal. Vertebral arteries: Left dominant.  Normal. Superior cerebellar arteries: Normal. Anterior inferior cerebellar arteries: Normal. Posterior inferior cerebellar arteries: Normal. MRA NECK FINDINGS Aortic arch: There is a normal 3 vessel branching pattern. The proximal subclavian arteries are normal. No aneurysm or dissection. Right carotid system:  Normal Left carotid system:  Normal Vertebral arteries: Both vertebral artery origins are widely patent. The cervical segments of both vertebral arteries are normal to their confluence at the basilar artery. Visualization of the neck is otherwise unremarkable. IMPRESSION: 1. No acute intracranial abnormality. 2. No occlusion or high-grade stenosis of the intracranial arteries. 3. Normal MRA of the neck. 4. 2 mm outpouching from the cavernous segment of the left internal carotid artery, small aneurysm versus vascular infundibulum. Electronically Signed   By: Deatra Robinson M.D.   On: 01/26/2016 21:51   Mr Brain Wo Contrast  Result Date: 01/26/2016 CLINICAL DATA:  Acute left-sided weakness EXAM: MRI HEAD WITHOUT CONTRAST MRA HEAD WITHOUT CONTRAST MRA NECK WITH AND WITHOUT CONTRAST TECHNIQUE: Multiplanar, multiecho pulse sequences of the brain and surrounding structures were obtained without intravenous contrast. Angiographic images of the Circle of Willis were obtained using MRA technique without intravenous contrast. Angiographic  images of the neck were obtained using MRA technique with and without intravenous contrast. Carotid stenosis measurements (when applicable) are obtained utilizing NASCET criteria, using the distal internal carotid diameter as the denominator. COMPARISON:  None. FINDINGS: MRI HEAD FINDINGS Brain: No acute infarct or intraparenchymal hemorrhage. The midline structures are normal. No focal parenchymal signal abnormality. No mass lesion or midline shift. No hydrocephalus or extra-axial fluid collection. No age advanced or lobar predominant atrophy. Vascular: Major intracranial  arterial and venous sinus flow voids are preserved. No evidence of chronic microhemorrhage or amyloid angiopathy. Skull and upper cervical spine: The visualized skull base, calvarium, upper cervical spine and extracranial soft tissues are normal. Sinuses/Orbits: No fluid levels or advanced mucosal thickening. No mastoid effusion. Normal orbits. MRA HEAD FINDINGS Intracranial internal carotid arteries: There is a small outpouching from the lateral aspect of the cavernous segment of the left internal carotid artery, measuring 2 mm base to apex and 2 mm at its base. Anterior cerebral arteries: Normal. Middle cerebral arteries: Normal. Posterior communicating arteries: Absent bilaterally. Posterior cerebral arteries: Normal. Basilar artery: Normal. Vertebral arteries: Left dominant. Normal. Superior cerebellar arteries: Normal. Anterior inferior cerebellar arteries: Normal. Posterior inferior cerebellar arteries: Normal. MRA NECK FINDINGS Aortic arch: There is a normal 3 vessel branching pattern. The proximal subclavian arteries are normal. No aneurysm or dissection. Right carotid system:  Normal Left carotid system:  Normal Vertebral arteries: Both vertebral artery origins are widely patent. The cervical segments of both vertebral arteries are normal to their confluence at the basilar artery. Visualization of the neck is otherwise unremarkable. IMPRESSION: 1. No acute intracranial abnormality. 2. No occlusion or high-grade stenosis of the intracranial arteries. 3. Normal MRA of the neck. 4. 2 mm outpouching from the cavernous segment of the left internal carotid artery, small aneurysm versus vascular infundibulum. Electronically Signed   By: Deatra Robinson M.D.   On: 01/26/2016 21:51   Ct Head Code Stroke W/o Cm  Result Date: 01/26/2016 CLINICAL DATA:  Code stroke.  Dizziness and left-sided facial droop. EXAM: CT HEAD WITHOUT CONTRAST TECHNIQUE: Contiguous axial images were obtained from the base of the skull  through the vertex without intravenous contrast. COMPARISON:  None. FINDINGS: Brain: No mass lesion, intraparenchymal hemorrhage or extra-axial collection. No evidence of acute cortical infarct. Brain parenchyma and CSF-containing spaces are normal for age. Vascular: No hyperdense vessel or unexpected calcification. Skull: Normal visualized skull base, calvarium and extracranial soft tissues. Sinuses/Orbits: No sinus fluid levels or advanced mucosal thickening. No mastoid effusion. Normal orbits. ASPECTS [facility] Stroke Program Early CT Score) - Ganglionic level infarction (caudate, lentiform nuclei, internal capsule, insula, M1-M3 cortex): 7 - Supraganglionic infarction (M4-M6 cortex): 3 Total score (0-10 with 10 being normal): 10 IMPRESSION: 1. Normal head CT. 2. ASPECTS is 10. These results were called by telephone at the time of interpretation on 01/26/2016 at 6:18 pm to Dr. Pricilla Loveless , who verbally acknowledged these results. Electronically Signed   By: Deatra Robinson M.D.   On: 01/26/2016 18:18    Procedures Procedures (including critical care time)  Medications Ordered in ED Medications  gadobenate dimeglumine (MULTIHANCE) injection 17 mL (17 mLs Intravenous Contrast Given 01/26/16 2058)     Initial Impression / Assessment and Plan / ED Course  I have reviewed the triage vital signs and the nursing notes.  Pertinent labs & imaging results that were available during my care of the patient were reviewed by me and considered in my medical decision making (see chart for details).  Clinical  Course as of Jan 26 2307  Sat Jan 26, 2016  [id number] Code stroke called in triage. However his last known normal was yesterday due to dizzy/blurred vision. Consult neuro  [SG]  1817 Dr. Amada JupiterKirkpatrick will come see, not a code stroke. MR brain, MRA brain, MRA neck  [SG]    Clinical Course User Index [SG] Pricilla LovelessScott Rayvon Dakin, MD    Patient actually now feels completely asymptomatic. His MRI and MRA are  negative. Discussed again with neuro, feels like it was probably a complicated migraine. Patient had no headache for me but after I left before neuro evaluated be stated he had a headache behind his eyes. Patient now feels at baseline. Recommend neuro follow-up and strict return precautions. Possible small aneurysm versus infundibulum, will need neuro follow-up for this as well. No risk factors for TIA, can have outpatient workup as this is also considered less likely  Final Clinical Impressions(s) / ED Diagnoses   Final diagnoses:  Facial droop  Numbness and tingling in left arm    New Prescriptions New Prescriptions   No medications on file     Pricilla LovelessScott Torrence Hammack, MD 01/26/16 2308

## 2016-01-28 ENCOUNTER — Telehealth: Payer: Self-pay | Admitting: Family Medicine

## 2016-01-28 DIAGNOSIS — R29810 Facial weakness: Secondary | ICD-10-CM

## 2016-01-28 NOTE — Telephone Encounter (Signed)
Ordered. Thanks

## 2016-01-28 NOTE — Telephone Encounter (Signed)
Patient advised.

## 2016-01-28 NOTE — Telephone Encounter (Signed)
Pt was seen at [place] over weekend and was told he needs referral to neurologist this week.   Please call [phone] Thanks

## 2016-01-30 ENCOUNTER — Ambulatory Visit (INDEPENDENT_AMBULATORY_CARE_PROVIDER_SITE_OTHER): Payer: 59 | Admitting: Neurology

## 2016-01-30 ENCOUNTER — Encounter: Payer: Self-pay | Admitting: Neurology

## 2016-01-30 VITALS — BP 120/74 | HR 68 | Resp 14 | Ht 75.0 in | Wt 186.5 lb

## 2016-01-30 DIAGNOSIS — R4781 Slurred speech: Secondary | ICD-10-CM

## 2016-01-30 DIAGNOSIS — G4726 Circadian rhythm sleep disorder, shift work type: Secondary | ICD-10-CM

## 2016-01-30 DIAGNOSIS — G459 Transient cerebral ischemic attack, unspecified: Secondary | ICD-10-CM

## 2016-01-30 DIAGNOSIS — R42 Dizziness and giddiness: Secondary | ICD-10-CM | POA: Diagnosis not present

## 2016-01-30 MED ORDER — MODAFINIL 200 MG PO TABS: 200.0000 mg | ORAL_TABLET | Freq: Every day | ORAL | 5 refills | Status: DC

## 2016-01-30 NOTE — Progress Notes (Signed)
[facility]  PATIENT: [name] Simpson DOB: 02-Jan-1981  REFERRING DOCTOR OR PCP:  Elsie Stain SOURCE: patient, ED notes  _________________________________   HISTORICAL  CHIEF COMPLAINT:  Chief Complaint  Patient presents with  . Dizziness    [name] is here for eval of episode of dizziness, facial drooping, speech disturbance.  Sts. on Friday 11-24, he was at work Licensed conveyancer working nights).  He had intermittent dizzines, first noted after working on the computer, while walking in the hall at work. Dizziness was more constant upon waking Saturday 11--25-17.  Saturday evening he had sudden onset of left sided facial drooping that lasted about 4 min. or less, and speech disturbance that lasted about 43mn.  [facility] . Facial Drooping    Long ER; no clear diagnosis reached.  Sts. he was told he may have had a migraine.  Symptoms are completely resolved, have not recurred.  Hx. of head injury while in the [employer] in 2007 which he doesn't think is related to current issue, but wanted to mention.    .Marland KitchenSpeech Disturbance    HISTORY OF PRESENT ILLNESS:  [name]. He is a 35year old [facility] with dizziness, left facial droop and speech disturbance.  On 01/24/2016, he noted a little lightheadedness but it was not severe.   Around 5 pm 01/25/16, he was with his wife and he was unable to get words out and had a left face droop x 4-5 minutes and then started to improve, and was back to baseline 40 minutes.   He felt he was almost back to baseline whren he got to the ED.   Within a few minutes of being in the emergency room, he felt he was completely back to baseline. He had an MRI of the brain, MR angiogram of intracranial arteries and MR angiogram of neck arteries. The brain and the neck arteries were normal. The MR  angiogram of the intracranial arteries showed a 2 mm outpouching of the ophthalmic segment of the left internal carotid artery. I personally reviewed these images and this could represent either a small aneurysm or an infundibulum.    Blood work was normal. He was discharged home. No medications were prescribed. The rest of that day and the next day, he felt some lightheadedness but it resolved by yesterday. He currently feels completely at baseline  He denies any major head trauma. [place] the he was in and he hit his head. He did not lose consciousness but he did feel dazed for a short period of time afterwards. He denies any history of meningitis or encephalitis.     Stroke/TIA risk factors: He does not smoke. He does not have hypertension. He has slightly elevated cholesterol but has not needed to go on any treatment.  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 This pattern continues 4 days on and 4 days off and he is always doing nightshifts when he is working. He notes that he sometimes has trouble staying awake when he is more tired at work. He often has trouble falling asleep and staying asleep when he gets home. He tries to go  straight to bed when he gets home and uses light blocking curtains at home.  Partially, this is due to having a 37-monthold child.   He has never taken any wake promoting agent. He does try to wear sunglasses on his way home..Marland Kitchen  He does not use bright lights.    REVIEW OF SYSTEMS: Constitutional: No fevers, chills, sweats, or change in appetite.    He has shiftwork disorder. Eyes: No visual changes, double vision, eye pain Ear, nose and throat: No hearing loss, ear pain, nasal congestion, sore throat Cardiovascular: No chest pain, palpitations Respiratory: No shortness of breath at rest or with  exertion.   No wheezes GastrointestinaI: No nausea, vomiting, diarrhea, abdominal pain, fecal incontinence Genitourinary: No dysuria, urinary retention or frequency.  No nocturia. Musculoskeletal: No neck pain, back pain Integumentary: No rash, pruritus, skin lesions Neurological: as above Psychiatric: No depression at this time.  No anxiety Endocrine: No palpitations, diaphoresis, change in appetite, change in weigh or increased thirst Hematologic/Lymphatic: No anemia, purpura, petechiae. Allergic/Immunologic: No itchy/runny eyes, nasal congestion, recent allergic reactions, rashes  ALLERGIES: No Known Allergies  HOME MEDICATIONS:  Current Outpatient Prescriptions:  .  Clobetasol Propionate (TEMOVATE) 0.05 % external spray, Apply topically 2 (two) times daily., Disp: , Rfl:  .  saccharomyces boulardii (FLORASTOR) 250 MG capsule, Take 250 mg by mouth daily., Disp: , Rfl:  .  modafinil (PROVIGIL) 200 MG tablet, Take 1 tablet (200 mg total) by mouth daily., Disp: 30 tablet, Rfl: 5  PAST MEDICAL HISTORY: Past Medical History:  Diagnosis Date  . Alcoholism in family   . Hydrocele, left 04/2010   Seen on testicular [place] small  . Psoriasis    per Dr. LAllyson Sabal . Psoriatic arthritis ([place]    per Dr. TCharlestine Night . Ulnar nerve injury     PAST SURGICAL HISTORY: Past Surgical History:  Procedure Laterality Date  . FB removed, right arm  2008   h/o ulnar nerve effects in hand - numbness  . MOUTH SURGERY  Age 35   FAMILY HISTORY: Family History  Problem Relation Age of Onset  . Irritable bowel syndrome Mother   . Hypertension Mother   . Alcohol abuse Father   . Cirrhosis Father   . Cancer Paternal Grandmother     colon CA, colon polyps  . Diabetes Paternal Grandmother   . Diabetes Maternal Grandfather   . Alcohol abuse Other   . Hyperlipidemia Other   . Hypertension Other   . Heart disease Other   . Hypertension Other   . Stroke Other   . Prostate cancer Neg Hx      SOCIAL HISTORY:  Social History   Social History  . Marital status: Single    Spouse name: N/A  . Number of children: N/A  . Years of education: N/A   Occupational History  . Not on file.   Social History Main Topics  . Smoking status: Never Smoker  . Smokeless tobacco: Never Used  . Alcohol use 0.0 oz/week     Comment: Once every 3 months  . Drug use: No  . Sexual activity: Not on file   Other Topics Concern  . Not on file   Social History Narrative   Education:  Associates   [place] [employer] [place]  Enjoys working on cars   Married April 250[address]  [employer]   Daughter Hailey born 03/2015     PHYSICAL EXAM  Vitals:   01/30/16 0923  BP: 120/74  Pulse: 68  Resp: 14  Weight: 186 lb 8 oz (84.6 kg)  Height: '6\' 3"'  (1.905 m)    Body mass index is 23.31 kg/m.   General: The patient is well-developed and well-nourished and in no acute distress  Eyes:  Funduscopic exam shows normal optic discs and retinal vessels.  Neck: The neck is supple, no carotid bruits are noted.  The neck is nontender.  Cardiovascular: The heart has a regular rate and rhythm with a normal S1 and S2. There were no murmurs, gallops or rubs. Lungs are clear to auscultation.  Skin: Extremities are without significant edema.  Musculoskeletal:  Back is nontender  Neurologic Exam  Mental status: The patient is alert and oriented x 3 at the time of the examination. The patient has apparent normal recent and remote memory, with an apparently normal attention span and concentration ability.   Speech is normal.  Cranial nerves: Extraocular movements are full. Pupils are equal, round, and reactive to light and accomodation.  Visual fields are full.  Facial symmetry is present. There is good facial sensation to soft touch bilaterally.Facial strength is normal.  Trapezius and sternocleidomastoid strength is normal. No dysarthria is noted.  The  tongue is midline, and the patient has symmetric elevation of the soft palate. No obvious hearing deficits are noted.  Motor:  Muscle bulk is normal.   Tone is normal. Strength is  5 / 5 in all 4 extremities.   Sensory: Sensory testing is intact to pinprick, soft touch and vibration sensation in all 4 extremities.  Coordination: Cerebellar testing reveals good finger-nose-finger and heel-to-shin bilaterally.  Gait and station: Station is normal.   Gait is normal. Tandem gait is normal. Romberg is negative.   Reflexes: Deep tendon reflexes are symmetric and normal bilaterally.   Plantar responses are flexor.    DIAGNOSTIC DATA (LABS, IMAGING, TESTING) - I reviewed patient records, labs, notes, testing and imaging myself where available.  Lab Results  Component Value Date   WBC 6.2 01/26/2016   HGB 15.6 01/26/2016   HCT 46.0 01/26/2016   MCV 88.5 01/26/2016   PLT 165 01/26/2016      Component Value Date/Time   NA 140 01/26/2016 1807   K 4.2 01/26/2016 1807   CL 104 01/26/2016 1807   CO2 25 01/26/2016 1800   GLUCOSE 93 01/26/2016 1807   BUN 19 01/26/2016 1807   CREATININE 1.00 01/26/2016 1807   CALCIUM 9.4 01/26/2016 1800   PROT 8.0 01/26/2016 1800   ALBUMIN 4.8 01/26/2016 1800   AST 29 01/26/2016 1800   ALT 33 01/26/2016 1800   ALKPHOS 61 01/26/2016 1800   BILITOT 0.9 01/26/2016 1800   GFRNONAA >60 01/26/2016 1800   GFRAA >60 01/26/2016 1800   Lab Results  Component Value Date   CHOL 230 (H) 03/18/2011   HDL 43.50 03/18/2011   LDLDIRECT 156.6 03/18/2011   TRIG 179.0 (H) 03/18/2011   CHOLHDL 5 03/18/2011   No results found for: HGBA1C No results found for: VITAMINB12 Lab Results  Component Value Date   TSH 1.02 03/18/2011       ASSESSMENT AND PLAN  Transient cerebral ischemia, unspecified type - Plan: ECHOCARDIOGRAM COMPLETE, EEG adult, Lipid Panel, Homocysteine, Sedimentation rate  Slurred speech - Plan: EEG adult  Lightheaded - Plan: EEG  adult  Shift work sleep disorder   In summary, [name] Simpson is a 35 year old man who appears to have had a transient ischemic attack  with slurred speech, aphasia and left facial droop a few days ago. MRI of the brain did not show any acute findings and he did not have any stenosis in the neck or the brain. There was a 2 mm outpouching of the ophthalmic segment of the left internal carotid artery that could be either a small aneurysm or an infundibulum. He is completely back to baseline.  Although symptoms were certainly consistent with a transient ischemic attack, this would be unusual given his age, general good health and MRI findings. We will check a bubble contrasted echocardiogram to make sure that there is not an intracardiac source of emboli. Additionally I will check homocystine, lipid panel and ESR. He will come back in within a few days when he is fasting. I will also check an EEG as seizure can mimic TIA.       A second problem is shift work disorder and he is often sleepy while he is working and then sleeping poorly when he is home. To try to help regulate the circadian issue, I prescribed Provigil to take at the beginning of his shift Additionally, he is advised to use a light box before he begins to work and to wear [employer] on his way back home.    He will return to see me in 6 weeks or sooner if there are new or worsening neurologic symptoms.  Here for asking me to see [name] Simpson. Please let me know if I can be of further assistance with him or other patients in the future.   Richard A. Felecia Shelling, MD, PhD 18/56/3149, 7:02 PM Certified in Neurology, Clinical Neurophysiology, Sleep Medicine, Pain Medicine and Neuroimaging  [facility] [address], [place], [place] [address] [phone] light

## 2016-01-30 NOTE — Patient Instructions (Signed)
10,000 Lux Light Box (blue tint is best) Amber colored sunglasses driving home

## 2016-02-07 ENCOUNTER — Telehealth: Payer: Self-pay | Admitting: Neurology

## 2016-02-07 NOTE — Telephone Encounter (Signed)
[facility] Pt canceled feb 2018 appt.

## 2016-02-13 ENCOUNTER — Ambulatory Visit (INDEPENDENT_AMBULATORY_CARE_PROVIDER_SITE_OTHER): Payer: 59 | Admitting: Family Medicine

## 2016-02-13 ENCOUNTER — Encounter: Payer: Self-pay | Admitting: Family Medicine

## 2016-02-13 DIAGNOSIS — G4726 Circadian rhythm sleep disorder, shift work type: Secondary | ICD-10-CM | POA: Diagnosis not present

## 2016-02-13 MED ORDER — TRAZODONE HCL 50 MG PO TABS: 25.0000 mg | ORAL_TABLET | Freq: Every evening | ORAL | 3 refills | Status: DC | PRN

## 2016-02-13 MED ORDER — MELATONIN 5 MG PO TABS: 5.0000 mg | ORAL_TABLET | Freq: Every day | ORAL | Status: DC

## 2016-02-13 NOTE — Progress Notes (Signed)
Trouble sleeping.  Was promoted at work, back working patrol.  Now working 4 days on, 4 days off.  Working 3rd shift.  That likely contributes to the trouble sleeping.  He prev did better on a better shift.  He isn't able to get back to sleep when he wakes up in the late AM.  This has been going on for months.  Not taking provigil, never took it.  Med list corrected.    He is only getting about 4 hours of sleep at night.  We talked about med to try to stay awake vs get to sleep.  No illicits, no etoh.  nyquil doesn't help.  Tylenol PM never helped. Tried light therapy, bought a light box and used sunglasses in the AM.    Prev neuro event d/w pt.  He clearly could tell something was wrong, had facial droop and speech changes.  The sx resolved in the meantime.  [facility]  He has had no sx in the meantime.    Flu shot prev done.    Meds, vitals, and allergies reviewed.   ROS: Per HPI unless specifically indicated in ROS section   GEN: nad, alert and oriented HEENT: mucous membranes moist NECK: supple w/o LA CV: rrr. PULM: ctab, no inc wob

## 2016-02-13 NOTE — Progress Notes (Signed)
Pre visit review using our clinic review tool, if applicable. No additional management support is needed unless otherwise documented below in the visit note. 

## 2016-02-13 NOTE — Patient Instructions (Addendum)
Try melatonin before bed.  If not better, then try the trazodone.  Update me as needed.   Take care.  Glad to see you.

## 2016-02-14 DIAGNOSIS — G4726 Circadian rhythm sleep disorder, shift work type: Secondary | ICD-10-CM | POA: Insufficient documentation

## 2016-02-14 NOTE — Assessment & Plan Note (Signed)
Discussed with patient about options. Reasonable to try melatonin at night. Continue using sunglasses in the morning on the way home from work and continue using the light box. If not improved with that he can try trazodone. Start with 25 mg, increase to 50 mg if needed. He should probably continue this medication, if effective, on the nights when he is working and also on the nights when he is between shifts.   The other issues the neurologic event that he had. He would be low risk for TIA. Possible he could've had atypical seizure versus atypical migraine. Discussed with patient. He is sleep deprived at baseline and this could've contributed. [facility]. At this point no more symptoms. Okay for outpatient follow-up. He agrees.

## 2016-03-27 DIAGNOSIS — L4050 Arthropathic psoriasis, unspecified: Secondary | ICD-10-CM | POA: Diagnosis not present

## 2016-03-30 ENCOUNTER — Emergency Department
Admission: EM | Admit: 2016-03-30 | Discharge: 2016-03-30 | Disposition: A | Discharge status: Home / Self Care | Payer: 59 | Attending: Emergency Medicine | Admitting: Emergency Medicine

## 2016-03-30 DIAGNOSIS — R21 Rash and other nonspecific skin eruption: Secondary | ICD-10-CM | POA: Diagnosis not present

## 2016-03-30 DIAGNOSIS — L509 Urticaria, unspecified: Secondary | ICD-10-CM | POA: Diagnosis not present

## 2016-03-30 DIAGNOSIS — J351 Hypertrophy of tonsils: Secondary | ICD-10-CM

## 2016-03-30 LAB — COMPREHENSIVE METABOLIC PANEL
ALBUMIN: 4.8 g/dL (ref 3.5–5.0)
ALT: 39 U/L (ref 17–63)
ANION GAP: 5 (ref 5–15)
AST: 31 U/L (ref 15–41)
Alkaline Phosphatase: 76 U/L (ref 38–126)
BILIRUBIN TOTAL: 0.9 mg/dL (ref 0.3–1.2)
BUN: 14 mg/dL (ref 6–20)
CO2: 28 mmol/L (ref 22–32)
Calcium: 9.2 mg/dL (ref 8.9–10.3)
Chloride: 103 mmol/L (ref 101–111)
Creatinine, Ser: 1.13 mg/dL (ref 0.61–1.24)
GLUCOSE: 107 mg/dL — AB (ref 65–99)
POTASSIUM: 4.1 mmol/L (ref 3.5–5.1)
Sodium: 136 mmol/L (ref 135–145)
TOTAL PROTEIN: 8.5 g/dL — AB (ref 6.5–8.1)

## 2016-03-30 LAB — CBC WITH DIFFERENTIAL/PLATELET
BASOS PCT: 0 %
Basophils Absolute: 0 10*3/uL (ref 0–0.1)
Eosinophils Absolute: 0 10*3/uL (ref 0–0.7)
Eosinophils Relative: 0 %
HEMATOCRIT: 44.2 % (ref 40.0–52.0)
Hemoglobin: 15.2 g/dL (ref 13.0–18.0)
LYMPHS ABS: 1.2 10*3/uL (ref 1.0–3.6)
Lymphocytes Relative: 13 %
MCH: 30.2 pg (ref 26.0–34.0)
MCHC: 34.3 g/dL (ref 32.0–36.0)
MCV: 87.9 fL (ref 80.0–100.0)
MONO ABS: 0.3 10*3/uL (ref 0.2–1.0)
MONOS PCT: 3 %
NEUTROS ABS: 8 10*3/uL — AB (ref 1.4–6.5)
Neutrophils Relative %: 84 %
Platelets: 180 10*3/uL (ref 150–440)
RBC: 5.03 MIL/uL (ref 4.40–5.90)
RDW: 12.6 % (ref 11.5–14.5)
WBC: 9.5 10*3/uL (ref 3.8–10.6)

## 2016-03-30 LAB — POCT RAPID STREP A: STREPTOCOCCUS, GROUP A SCREEN (DIRECT): NEGATIVE

## 2016-03-30 MED ORDER — DIPHENHYDRAMINE HCL 50 MG/ML IJ SOLN
25.0000 mg | Freq: Once | INTRAMUSCULAR | Status: AC
  Administered 2016-03-30: 25 mg via INTRAVENOUS
  Filled 2016-03-30: qty 1

## 2016-03-30 MED ORDER — EPINEPHRINE PF 1 MG/ML IJ SOLN
INTRAMUSCULAR | Status: AC
  Filled 2016-03-30: qty 1

## 2016-03-30 MED ORDER — PREDNISONE 20 MG PO TABS: 40.0000 mg | ORAL_TABLET | Freq: Every day | ORAL | 0 refills | Status: DC

## 2016-03-30 MED ORDER — DOXYCYCLINE HYCLATE 100 MG PO CAPS: 100.0000 mg | ORAL_CAPSULE | Freq: Two times a day (BID) | ORAL | 0 refills | Status: DC

## 2016-03-30 MED ORDER — EPINEPHRINE 0.3 MG/0.3ML IJ SOAJ: 0.3000 mg | Freq: Once | INTRAMUSCULAR | 0 refills | Status: AC

## 2016-03-30 MED ORDER — PREDNISONE 20 MG PO TABS
40.0000 mg | ORAL_TABLET | Freq: Once | ORAL | Status: AC
  Administered 2016-03-30: 40 mg via ORAL
  Filled 2016-03-30: qty 2

## 2016-03-30 NOTE — Discharge Instructions (Signed)
Follow-up closely with your regular doctor this week.  Get help right away if: You have severe swelling of your mouth, tongue, or lips. You have trouble breathing. You have trouble swallowing. You faint. Develop a fever, increased neck pain, or headache Other new concerns or symptoms arise

## 2016-03-30 NOTE — ED Provider Notes (Signed)
[facility] Emergency Department Provider Note   ____________________________________________   First MD Initiated Contact with Patient 03/30/16 1918     (approximate)  I have reviewed the triage vital signs and the nursing notes.   HISTORY  Chief Complaint Allergic Reaction   HPI [name] Simpson is a 36 y.o. male here for evaluation of a scratchy feelingwhich occurred in his throat and itching and rash. Patient reports he is been having an itchy rash over his groin and forearms which he describes "hives" which seemed to her with taking in a drill which he is been taking for the last 48 hours, 25 mg as needed.  This evening he began to feel scratchy feeling and a strange sensation in his lower throat. This prompted her to come emergency room as he is concerned he could be an allergic reaction. No known exposures. No known allergy to anything. Takes no medications regularly, and used to take methotrexate was discontinued this well over 3 months ago for psoriasis.  No fevers or chills. No nausea or vomiting. He reports that he notices left tonsil looks swollen when he evaluated it. Reportedly has a history of strep multiple times in the past, reports this does not feel anything like that. No trouble swallowing. He has not seen any swelling of his face.  No nausea or vomiting. No wheezing.  Past Medical History:  Diagnosis Date  . Alcoholism in family   . Hydrocele, left 04/2010   Seen on testicular US, small  . Psoriasis    per Dr. Terri PiedraLupton  . Psoriatic arthritis (HCC)    per Dr. Kellie Simmeringruslow  . Ulnar nerve injury     Patient Active Problem List   Diagnosis Date Noted  . Shift work sleep disorder 02/14/2016  . TIA (transient ischemic attack) 01/30/2016  . Slurred speech 01/30/2016  . Lightheaded 01/30/2016  . Back pain 07/02/2015  . Psoriatic arthritis (HCC) 07/02/2015  . Diarrhea 07/02/2015  . Neck pain 01/25/2014  . Ankle pain 01/17/2012  .  Hypogonadism male 04/30/2011  . Routine general medical examination at a health care facility 03/21/2011  . Fatigue 03/21/2011  . PSORIASIS 04/12/2010    Past Surgical History:  Procedure Laterality Date  . FB removed, right arm  2008   h/o ulnar nerve effects in hand - numbness  . MOUTH SURGERY  Age 36    Prior to Admission medications   Medication Sig Start Date End Date Taking? Authorizing Provider  Clobetasol Propionate (TEMOVATE) 0.05 % external spray Apply topically 2 (two) times daily.    Historical Provider, MD  doxycycline (VIBRAMYCIN) 100 MG capsule Take 1 capsule (100 mg total) by mouth 2 (two) times daily. 03/30/16   Sharyn CreamerMark Quale, MD  EPINEPHrine 0.3 mg/0.3 mL IJ SOAJ injection Inject 0.3 mLs (0.3 mg total) into the muscle once. 03/30/16 03/30/16  Sharyn CreamerMark Quale, MD  ibuprofen (ADVIL,MOTRIN) 200 MG tablet Take 200 mg by mouth every 6 (six) hours as needed.    Historical Provider, MD  Melatonin ([facility] MELATONIN) 5 MG TABS Take 1 tablet (5 mg total) by mouth at bedtime. 02/13/16   Joaquim NamGraham S Duncan, MD  predniSONE (DELTASONE) 20 MG tablet Take 2 tablets (40 mg total) by mouth daily with breakfast. 03/30/16   Sharyn CreamerMark Quale, MD  traZODone (DESYREL) 50 MG tablet Take 0.5-1 tablets (25-50 mg total) by mouth at bedtime as needed for sleep (if needed). 02/13/16   Joaquim NamGraham S Duncan, MD    Allergies Patient has no known  allergies.  Family History  Problem Relation Age of Onset  . Irritable bowel syndrome Mother   . Hypertension Mother   . Alcohol abuse Father   . Cirrhosis Father   . Cancer Paternal Grandmother     colon CA, colon polyps  . Diabetes Paternal Grandmother   . Diabetes Maternal Grandfather   . Alcohol abuse Other   . Hyperlipidemia Other   . Hypertension Other   . Heart disease Other   . Hypertension Other   . Stroke Other   . Prostate cancer Neg Hx     Social History Social History  Substance Use Topics  . Smoking status: Never Smoker  . Smokeless tobacco: Never Used    . Alcohol use 0.0 oz/week     Comment: Once every 3 months    Review of Systems Constitutional: No fever/chills Eyes: No visual changes. ENT: See history of present illness Cardiovascular: Denies chest pain. Respiratory: Denies shortness of breath. Gastrointestinal: No abdominal pain.  No nausea, no vomiting.  Genitourinary: Negative for dysuria. Musculoskeletal: Negative for back pain. Skin: See history of present illness Neurological: Negative for headaches, focal weakness or numbness.  Works as a Nurse, learning disability. No travel. No known exposures or tick bites.  10-point ROS otherwise negative.  ____________________________________________   PHYSICAL EXAM:  VITAL SIGNS: ED Triage Vitals [03/30/16 1911]  Enc Vitals Group     BP (!) 143/82     Pulse Rate 84     Resp 18     Temp 98.4 F (36.9 C)     Temp Source Oral     SpO2 100 %     Weight 185 lb (83.9 kg)     Height 6\' 2"  (1.88 m)     Head Circumference      Peak Flow      Pain Score      Pain Loc      Pain Edu?      Excl. in GC?     Constitutional: Alert and oriented. Well appearing and in no acute distress.Very well-built. Well mannered and very pleasant. Eyes: Conjunctivae are normal. PERRL. EOMI. Head: Atraumatic. Nose: No congestion/rhinnorhea. Mouth/Throat: Mucous membranes are moist.  Oropharynx non-erythematous. There is no lingular edema. There is no edema of the face, left submandibular area or palate/posterior pharynx, however his left tonsil only is hypertrophied without exudate. The uvula is midline. No evidence of edema noted in the posterior airway. There is no stridor. No cervical adenopathy. No rash over the neck. Neck: No stridor.   Cardiovascular: Normal rate, regular rhythm. Grossly normal heart sounds.  Good peripheral circulation. Respiratory: Normal respiratory effort.  No retractions. Lungs CTAB. Gastrointestinal: Soft and nontender. No distention. No abdominal bruits. No  CVA tenderness. Musculoskeletal: No lower extremity tenderness nor edema.  No joint effusions. Neurologic:  Normal speech and language. No gross focal neurologic deficits are appreciated. No gait instability. Skin:  Skin is warm, dry and intact. He slightly raised urticarial rash is noted over the intertriginous spaces of the groin, also over the forearms with excoriations from his itching. No open blistering or weeping rashes. No erythema chronica migrans. No purpura. Psychiatric: Mood and affect are normal. Speech and behavior are normal.  ____________________________________________   LABS (all labs ordered are listed, but only abnormal results are displayed)  Labs Reviewed  CBC WITH DIFFERENTIAL/PLATELET - Abnormal; Notable for the following:       Result Value   Neutro Abs 8.0 (*)  All other components within normal limits  COMPREHENSIVE METABOLIC PANEL - Abnormal; Notable for the following:    Glucose, Bld 107 (*)    Total Protein 8.5 (*)    All other components within normal limits  CULTURE, GROUP A STREP (THRC)  B. BURGDORFI ANTIBODIES  POCT RAPID STREP A   ____________________________________________  EKG   ____________________________________________  RADIOLOGY   ____________________________________________   PROCEDURES  Procedure(s) performed: None  Procedures  Critical Care performed: No  ____________________________________________   INITIAL IMPRESSION / ASSESSMENT AND PLAN / ED COURSE  Pertinent labs & imaging results that were available during my care of the patient were reviewed by me and considered in my medical decision making (see chart for details).  Patient presents with an urticarial rash, pruritus, and reportedly a scratchy feeling as though his throat was very funny. On exam there is no evidence of obvious angioedema, but a unilaterally hypertrophied left tonsil is noted. However he lacks fever or infectious symptomatology. I do not  believe it is obviously infectious, given his repeated history of strep in the past strep was done which is negative. He reports he's had mono previously, making this unlikely and his CBC argues against. It is possible this is an allergic reaction, and it's unclear if the hypertrophied left tonsil has anything to do with that or could potentially be chronic in nature. I do not find evidence of midline shift or angioedema involving the oropharynx. The patient reports that after receiving IV Benadryl and prednisone his symptomatology including funny feeling in his throat has resolved completely and his itching has also improved. His rash over the forearms is now gone, and I suspect most likely some type of allergic reaction. We'll prescribe prednisone, he will utilize over-the-counter Benadryl, and I provided instruction and a prescription for epinephrine pens in the event of severe worsening symptomatology. Also provided doxycycline prescription given associated rash, animal handling, and potential for infectious tonsillitis, no unilateral in nature. Careful return precautions discussed, and the patient is agreeable with this plan and will follow closely with his regular doctor.  Return precautions and treatment recommendations and follow-up discussed with the patient who is agreeable with the plan.       ____________________________________________   FINAL CLINICAL IMPRESSION(S) / ED DIAGNOSES  Final diagnoses:  Urticaria  Rash  Hypertrophy of tonsil      NEW MEDICATIONS STARTED DURING THIS VISIT:  New Prescriptions   DOXYCYCLINE (VIBRAMYCIN) 100 MG CAPSULE    Take 1 capsule (100 mg total) by mouth 2 (two) times daily.   EPINEPHRINE 0.3 MG/0.3 ML IJ SOAJ INJECTION    Inject 0.3 mLs (0.3 mg total) into the muscle once.   PREDNISONE (DELTASONE) 20 MG TABLET    Take 2 tablets (40 mg total) by mouth daily with breakfast.     Note:  This document was prepared using Dragon voice recognition  software and may include unintentional dictation errors.     Sharyn Creamer, MD 03/30/16 2132

## 2016-03-30 NOTE — ED Notes (Signed)
Pt ambulated to the toilet in room without difficulty. Pt's gait steady.

## 2016-03-30 NOTE — ED Notes (Signed)
Md at bedside

## 2016-03-30 NOTE — ED Triage Notes (Signed)
Pt with hives noted to forearms. Pt states took 25mg  of benadryl pta. Pt states "my throat feels funny". Pt appears in no acute distress. Pt states has  Had symptoms since yesterday.

## 2016-04-01 LAB — B. BURGDORFI ANTIBODIES: B burgdorferi Ab IgG+IgM: <0.91 {ISR} (ref 0.00–0.90)

## 2016-04-02 LAB — CULTURE, GROUP A STREP (THRC)

## 2016-04-09 ENCOUNTER — Ambulatory Visit: Payer: 59 | Admitting: Neurology

## 2016-04-10 ENCOUNTER — Telehealth (HOSPITAL_COMMUNITY): Payer: Self-pay

## 2016-04-10 NOTE — Telephone Encounter (Signed)
2/8 Left message on voicemail with call back information.  [phone] 

## 2016-04-30 DIAGNOSIS — K580 Irritable bowel syndrome with diarrhea: Secondary | ICD-10-CM | POA: Diagnosis not present

## 2016-06-03 DIAGNOSIS — L409 Psoriasis, unspecified: Secondary | ICD-10-CM | POA: Diagnosis not present

## 2016-07-02 ENCOUNTER — Ambulatory Visit (INDEPENDENT_AMBULATORY_CARE_PROVIDER_SITE_OTHER): Payer: 59 | Admitting: Psychiatry

## 2016-07-02 ENCOUNTER — Encounter (HOSPITAL_COMMUNITY): Payer: Self-pay | Admitting: Psychiatry

## 2016-07-02 VITALS — BP 122/74 | HR 74 | Ht 74.0 in | Wt 181.2 lb

## 2016-07-02 DIAGNOSIS — Z79899 Other long term (current) drug therapy: Secondary | ICD-10-CM

## 2016-07-02 DIAGNOSIS — F4310 Post-traumatic stress disorder, unspecified: Secondary | ICD-10-CM

## 2016-07-02 DIAGNOSIS — Z811 Family history of alcohol abuse and dependence: Secondary | ICD-10-CM | POA: Diagnosis not present

## 2016-07-02 MED ORDER — FLUOXETINE HCL 20 MG PO CAPS: 20.0000 mg | ORAL_CAPSULE | Freq: Every day | ORAL | 1 refills | Status: DC

## 2016-07-02 MED ORDER — FLUOXETINE HCL 10 MG PO CAPS: 10.0000 mg | ORAL_CAPSULE | Freq: Every day | ORAL | 0 refills | Status: DC

## 2016-07-02 MED ORDER — MODAFINIL 100 MG PO TABS: 100.0000 mg | ORAL_TABLET | Freq: Every day | ORAL | Status: DC

## 2016-07-02 NOTE — Patient Instructions (Signed)
Start prozac 10 mg daily for 1 week  Then increase to 20 mg daily (the [place] will send the new 20 mg capsule)
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
## 2016-08-04 ENCOUNTER — Other Ambulatory Visit (HOSPITAL_COMMUNITY): Payer: Self-pay

## 2016-08-04 DIAGNOSIS — F4310 Post-traumatic stress disorder, unspecified: Secondary | ICD-10-CM

## 2016-08-04 MED ORDER — FLUOXETINE HCL 20 MG PO CAPS: 20.0000 mg | ORAL_CAPSULE | Freq: Every day | ORAL | 0 refills | Status: DC

## 2016-08-04 NOTE — Progress Notes (Signed)
Patient called and the prescription that patient sent to them they could not find, they finally did but now they are processing and he is out of medication, I sent another 30 day supply to patients local pharmacy and he states he will pay out of pocket.

## 2016-08-31 ENCOUNTER — Other Ambulatory Visit (HOSPITAL_COMMUNITY): Payer: Self-pay | Admitting: Psychiatry

## 2016-08-31 DIAGNOSIS — F4310 Post-traumatic stress disorder, unspecified: Secondary | ICD-10-CM

## 2016-09-04 ENCOUNTER — Ambulatory Visit (HOSPITAL_COMMUNITY): Payer: Self-pay | Admitting: Psychiatry

## 2016-09-07 ENCOUNTER — Other Ambulatory Visit: Payer: Self-pay | Admitting: Neurology

## 2016-09-30 ENCOUNTER — Telehealth: Payer: Self-pay

## 2016-09-30 NOTE — Telephone Encounter (Signed)
Pt left v/m; neurologist prescribed provigil and pt's treatment has ended with neurologist; pt wants to know if Dr Para Marchuncan will prescribed pt provigil; pt works 3rd shift and med helps pt stay awake. Pt request cb. Last seen 03/02/16 and no future appt scheduled. Pt is almost out of med. [facility] [place].

## 2016-10-01 MED ORDER — MODAFINIL 100 MG PO TABS: 100.0000 mg | ORAL_TABLET | Freq: Every day | ORAL | 0 refills | Status: DC

## 2016-10-01 NOTE — Telephone Encounter (Signed)
Agreed, thanks

## 2016-10-01 NOTE — Telephone Encounter (Signed)
Patient returned Regina's call.  Patient can be reached at [phone].

## 2016-10-01 NOTE — Telephone Encounter (Signed)
Patient notified as instructed by telephone and verbalized understanding. Patient stated that he will work on getting [facility]. Advised patient that Dr. Para Marchuncan will be back in the office tomorrow to sign the script for him to pick it up. Advised patient that we will call when script is ready. Patient stated that he will schedule follow-up appointment when he picks the script up.

## 2016-10-01 NOTE — Telephone Encounter (Signed)
If patient has been on med w/o adverse effect and if med has helped, then reasonable to continue.  He would need f/u here to discuss in general, but I printed the rx in the meantime.  [facility] please see if he can get any records in the meantime.  Thanks.

## 2016-10-01 NOTE — Telephone Encounter (Signed)
Left message on voicemail for patient to call back. 

## 2016-10-02 NOTE — Telephone Encounter (Signed)
Left detailed message on voicemail. Rx left at front desk for pick up.  

## 2016-11-26 DIAGNOSIS — K580 Irritable bowel syndrome with diarrhea: Secondary | ICD-10-CM | POA: Diagnosis not present

## 2016-11-26 DIAGNOSIS — Z800 Family history of malignant neoplasm of digestive organs: Secondary | ICD-10-CM | POA: Diagnosis not present

## 2018-01-14 DIAGNOSIS — L409 Psoriasis, unspecified: Secondary | ICD-10-CM | POA: Diagnosis not present

## 2018-03-04 DIAGNOSIS — T1500XA Foreign body in cornea, unspecified eye, initial encounter: Secondary | ICD-10-CM | POA: Diagnosis not present

## 2018-03-24 ENCOUNTER — Other Ambulatory Visit: Payer: Self-pay

## 2018-03-24 ENCOUNTER — Other Ambulatory Visit: Payer: Self-pay | Admitting: Family Medicine

## 2018-03-24 DIAGNOSIS — L4050 Arthropathic psoriasis, unspecified: Secondary | ICD-10-CM

## 2018-03-24 DIAGNOSIS — E785 Hyperlipidemia, unspecified: Secondary | ICD-10-CM

## 2018-03-25 ENCOUNTER — Encounter: Payer: 59 | Admitting: Family Medicine

## 2018-03-25 DIAGNOSIS — Z0289 Encounter for other administrative examinations: Secondary | ICD-10-CM

## 2018-04-10 DIAGNOSIS — J09X2 Influenza due to identified novel influenza A virus with other respiratory manifestations: Secondary | ICD-10-CM | POA: Diagnosis not present

## 2018-05-30 IMAGING — MR MR MRA NECK WO/W CM
4 of 10 series · 18 of 48 positions shown · non-contrast
Comparison: None.

CLINICAL DATA: Acute left-sided weakness

EXAM:
MRI HEAD WITHOUT CONTRAST
MRA HEAD WITHOUT CONTRAST
MRA NECK WITH AND WITHOUT CONTRAST
TECHNIQUE: Multiplanar, multiecho pulse sequences of the brain and surrounding
structures were obtained without intravenous contrast. Angiographic
images of the Circle of Willis were obtained using MRA technique
without intravenous contrast. Angiographic images of the neck were
obtained using MRA technique with and without intravenous contrast.
Carotid stenosis measurements (when applicable) are obtained
utilizing NASCET criteria, using the distal internal carotid
diameter as the denominator.

[Series 3: (id) asset · axial · 3.0mm · 0.55mm/px · z∈[-129,+131]mm · 5 of 140 slices shown]
[im 1/140]
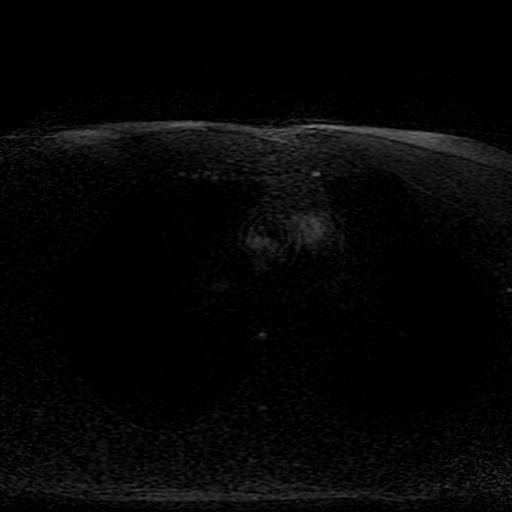
[im 35/140]
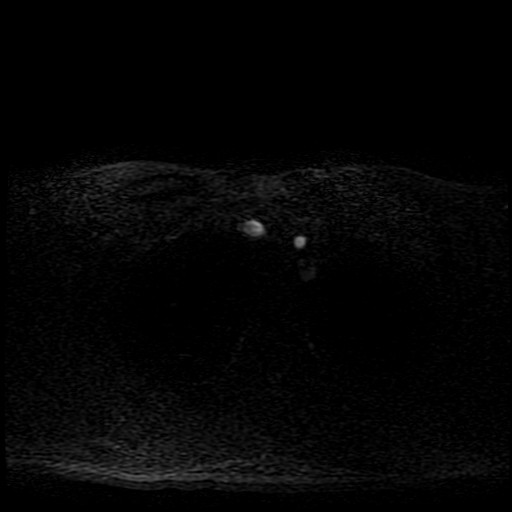
[im 70/140]
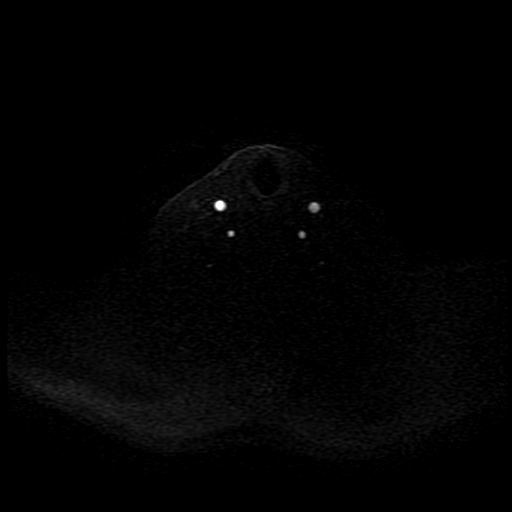
[im 105/140]
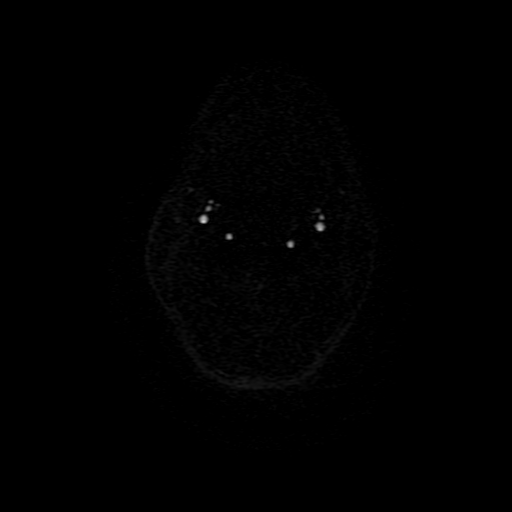
[im 140/140]
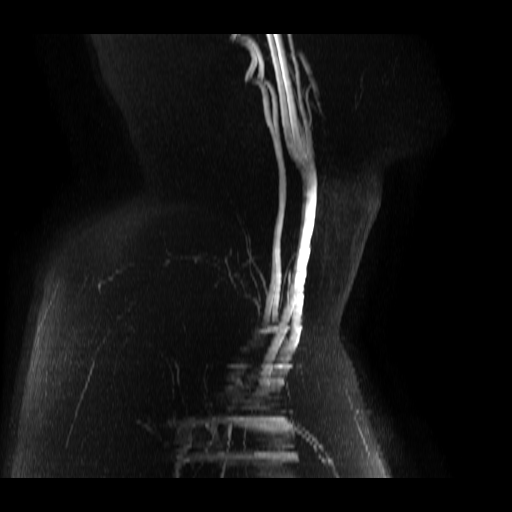

[Series 400: cor cemra ft · coronal · 1.4mm · 0.59mm/px · 5 of 128 slices shown]
[im 1/128]
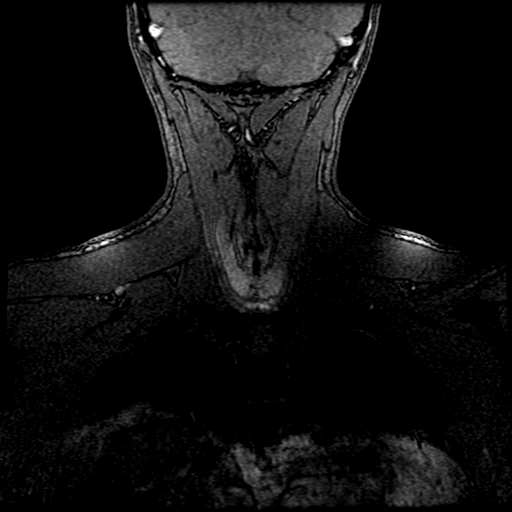
[im 32/128]
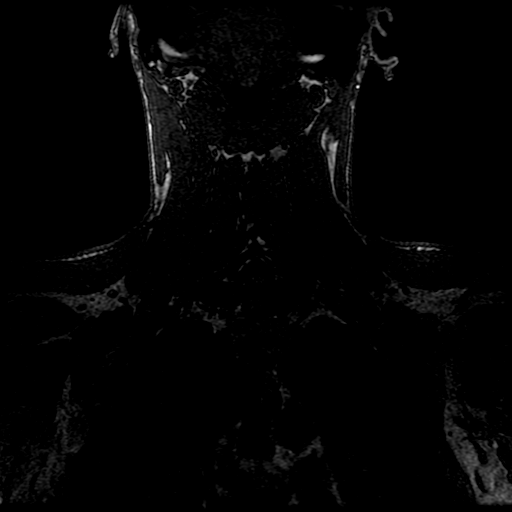
[im 64/128]
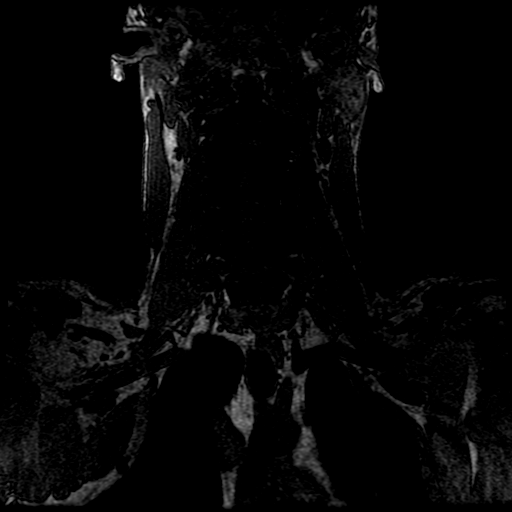
[im 96/128]
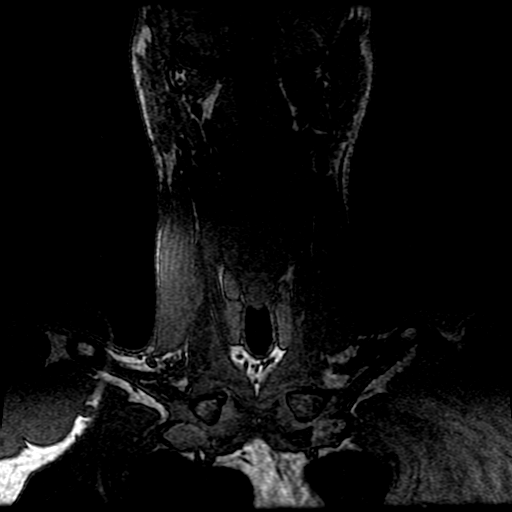
[im 128/128]
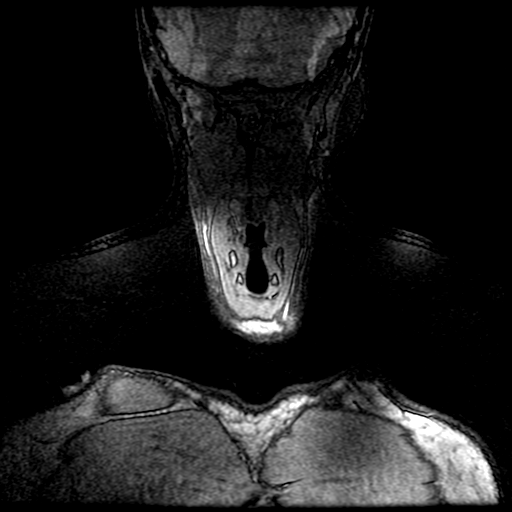

[Series 401: ph1/cor cemra ft · coronal · 1.4mm · 0.59mm/px · 5 of 128 slices shown]
[im 1/128]
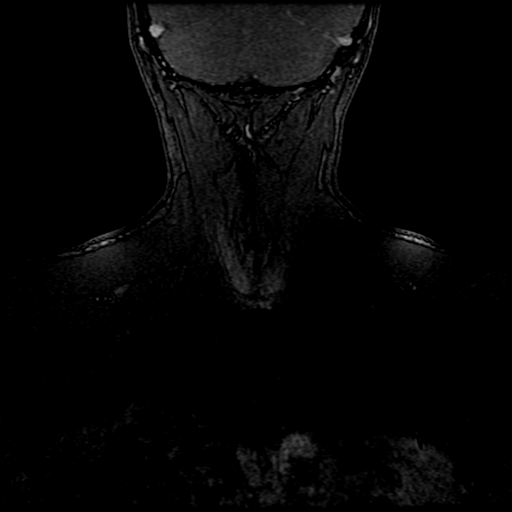
[im 26/128]
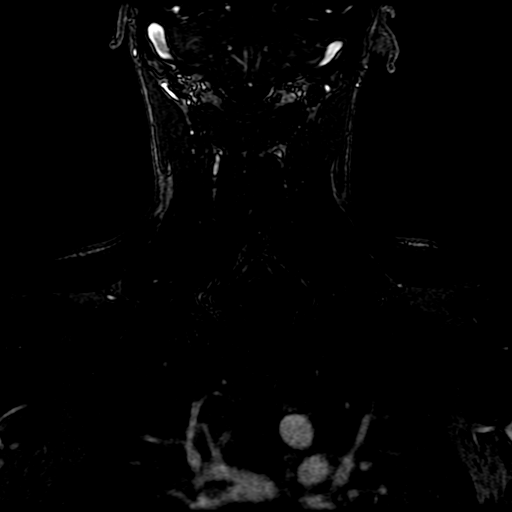
[im 51/128]
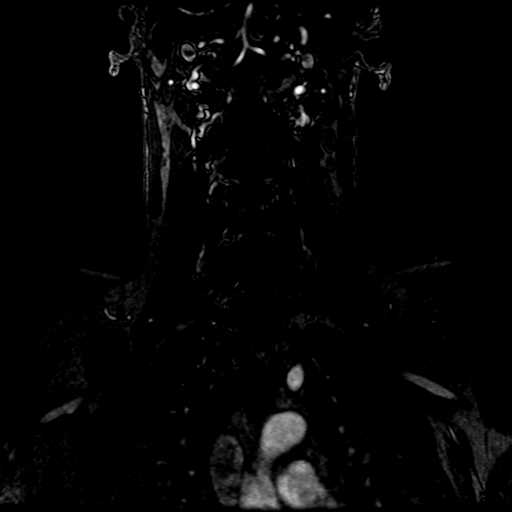
[im 77/128]
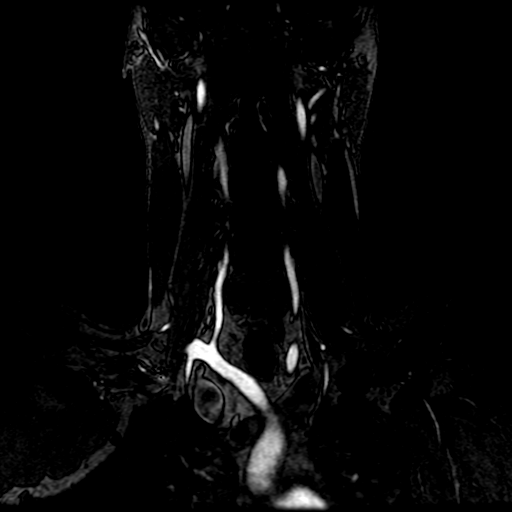
[im 128/128]
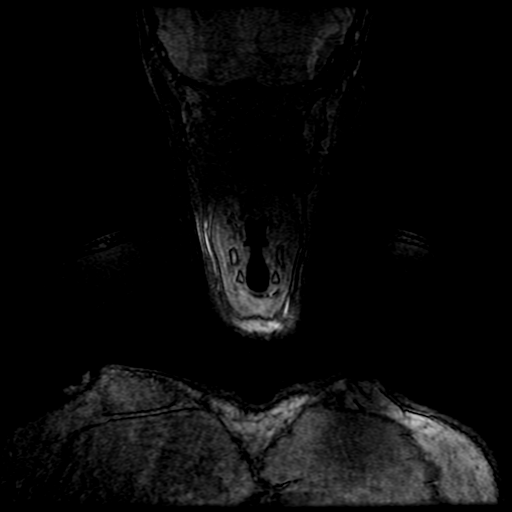

[Series 402: ph2/cor cemra ft · coronal · 1.4mm · 0.59mm/px · 3 of 128 slices shown]
[im 26/128]
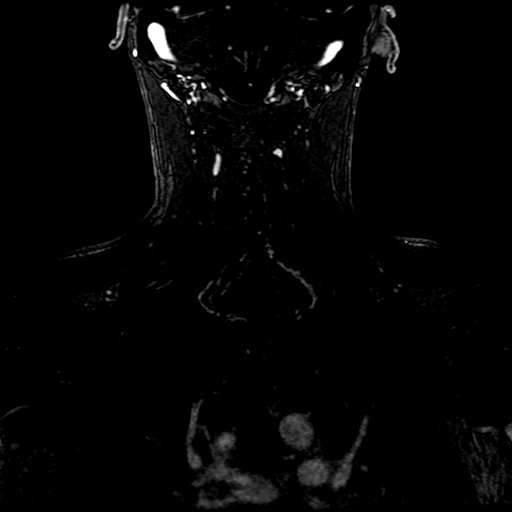
[im 77/128]
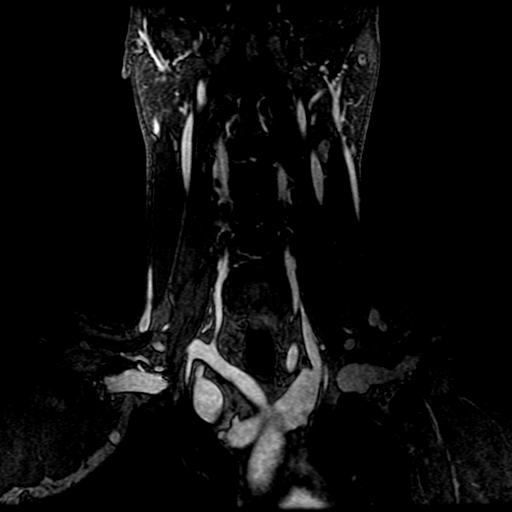
[im 128/128]
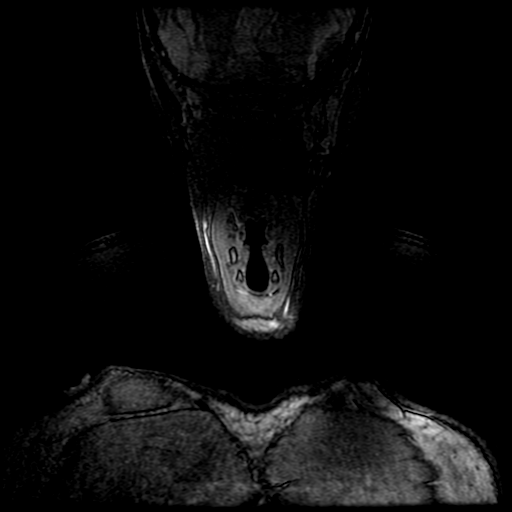

[18 of 48 positions shown; findings below may reference images not displayed]

FINDINGS: MRI HEAD FINDINGS

Brain: No acute infarct or intraparenchymal hemorrhage. The midline
structures are normal. No focal parenchymal signal abnormality. No
mass lesion or midline shift. No hydrocephalus or extra-axial fluid
collection. No age advanced or lobar predominant atrophy.

Vascular: Major intracranial arterial and venous sinus flow voids
are preserved. No evidence of chronic microhemorrhage or amyloid
angiopathy.

Skull and upper cervical spine: The visualized skull base,
calvarium, upper cervical spine and extracranial soft tissues are
normal.

Sinuses/Orbits: No fluid levels or advanced mucosal thickening. No
mastoid effusion. Normal orbits.

MRA HEAD FINDINGS

Intracranial internal carotid arteries: There is a small outpouching
from the lateral aspect of the cavernous segment of the left
internal carotid artery, measuring 2 mm base to apex and 2 mm at its
base.

Anterior cerebral arteries: Normal.

Middle cerebral arteries: Normal.

Posterior communicating arteries: Absent bilaterally.

Posterior cerebral arteries: Normal.

Basilar artery: Normal.

Vertebral arteries: Left dominant. Normal.

Superior cerebellar arteries: Normal.

Anterior inferior cerebellar arteries: Normal.

Posterior inferior cerebellar arteries: Normal.

MRA NECK FINDINGS

Aortic arch: There is a normal 3 vessel branching pattern. The
proximal subclavian arteries are normal. No aneurysm or dissection.

Right carotid system:  Normal

Left carotid system:  Normal

Vertebral arteries: Both vertebral artery origins are widely patent.
The cervical segments of both vertebral arteries are normal to their
confluence at the basilar artery.

Visualization of the neck is otherwise unremarkable.
IMPRESSION: 1. No acute intracranial abnormality.
2. No occlusion or high-grade stenosis of the intracranial arteries.
3. Normal MRA of the neck.
4. 2 mm outpouching from the cavernous segment of the left internal
carotid artery, small aneurysm versus vascular infundibulum.

## 2018-09-24 ENCOUNTER — Ambulatory Visit
Admission: RE | Admit: 2018-09-24 | Discharge: 2018-09-24 | Disposition: A | Discharge status: Home / Self Care | Payer: 59 | Source: Ambulatory Visit | Attending: Otolaryngology | Admitting: Otolaryngology

## 2018-09-24 ENCOUNTER — Other Ambulatory Visit: Payer: Self-pay | Admitting: Otolaryngology

## 2018-09-24 DIAGNOSIS — M542 Cervicalgia: Secondary | ICD-10-CM

## 2018-09-24 MED ORDER — IOPAMIDOL (ISOVUE-300) INJECTION 61%
75.0000 mL | Freq: Once | INTRAVENOUS | Status: AC | PRN
  Administered 2018-09-24: 75 mL via INTRAVENOUS

## 2019-03-13 ENCOUNTER — Encounter: Payer: Self-pay | Admitting: Family Medicine

## 2019-03-15 ENCOUNTER — Telehealth: Payer: Self-pay

## 2019-03-15 NOTE — Telephone Encounter (Signed)
Patient has been scheduled.  Thank you.

## 2019-03-15 NOTE — Telephone Encounter (Signed)
Patient contacted the office. He states that he had established care with Dr. Para March years ago, but he had to move his care to the [place].  He has now been approved through the [place] community care program and he is now able to establish care back with Dr. Para March. Patient was last seen here in 2017. Dr. Para March, would you take this patient back as a new patient? Please let me know and I will be more than happy to get him scheduled.

## 2019-03-15 NOTE — Telephone Encounter (Signed)
Thanks

## 2019-03-15 NOTE — Telephone Encounter (Signed)
Dr Para March did approve to have patient scheduled for re establish. Dr Para March received notes from [place] and have his comments on the hard copy on [place] documents. Please schedule when possible. Thank you

## 2019-03-25 ENCOUNTER — Ambulatory Visit (INDEPENDENT_AMBULATORY_CARE_PROVIDER_SITE_OTHER): Payer: No Typology Code available for payment source | Admitting: Family Medicine

## 2019-03-25 ENCOUNTER — Telehealth: Payer: Self-pay

## 2019-03-25 ENCOUNTER — Encounter: Payer: Self-pay | Admitting: Family Medicine

## 2019-03-25 ENCOUNTER — Other Ambulatory Visit: Payer: Self-pay

## 2019-03-25 VITALS — BP 128/76 | HR 82 | Temp 97.0°F | Ht 73.0 in | Wt 190.0 lb

## 2019-03-25 DIAGNOSIS — L408 Other psoriasis: Secondary | ICD-10-CM | POA: Diagnosis not present

## 2019-03-25 DIAGNOSIS — B070 Plantar wart: Secondary | ICD-10-CM | POA: Diagnosis not present

## 2019-03-25 DIAGNOSIS — H029 Unspecified disorder of eyelid: Secondary | ICD-10-CM

## 2019-03-25 DIAGNOSIS — L4050 Arthropathic psoriasis, unspecified: Secondary | ICD-10-CM

## 2019-03-25 MED ORDER — CLOBETASOL PROPIONATE 0.05 % EX LIQD: Freq: Two times a day (BID) | CUTANEOUS | 5 refills | Status: DC

## 2019-03-25 NOTE — Patient Instructions (Signed)
We'll call about the referrals.  Let me know if we need to do extra clearance through the [place].   Update me as needed.  Take care.  Glad to see you.

## 2019-03-25 NOTE — Progress Notes (Signed)
This visit occurred during the SARS-CoV-2 public health emergency.  Safety protocols were in place, including screening questions prior to the visit, additional usage of staff PPE, and extensive cleaning of exam room while observing appropriate contact time as indicated for disinfecting solutions.  To re-est care.    Wife designated if patient were incapacitated.    H/o psoriatic arthritis.  He had seen rheum prev.  He had prev hand pain and was started on MTX.  He is off med currently.  He did well for a while but then more pain In the last month.  R 3rd MCP red and sore episodically.    Psoriasis/psoriatic arthritis.  He prev used a steroid dose pack and he then realized that his joint pain was a lot better and his baseline AM stiffness was a problem that improved in steroids.  He isn't in the midst of a flare now but he has AM stiffness.  He is needing clobetasol daily for psoriatic lesions.    He had prev lymphadenopathy that resolved with abx tx per ENT.  He has 2 plantar warts on the right foot.  See exam.  No intervention yet.  He had a cystic lesion appearing on the left lower eyelid, on the inner side.  Not enlarging in the meantime.  No drainage.  He had previous evaluation done.  PMH and SH reviewed  ROS: Per HPI unless specifically indicated in ROS section   Meds, vitals, and allergies reviewed.   GEN: nad, alert and oriented HEENT:ncat, small lesion noted on the left lower eyelid, inner.  No drainage.  No spreading erythema. NECK: supple w/o LA CV: rrr.  PULM: ctab, no inc wob ABD: soft, +bs EXT: no edema SKIN: no acute rash Right foot with 2 benign-appearing plantar warts noted, small.

## 2019-03-25 NOTE — Telephone Encounter (Signed)
Spoke with patient, we were receiving PA request for Clobetasol spray. Patient stated that it should be covered through his [facility] insurance. [facility] called [facility] and they had his previous insurance card on file, they do not take [place] insurance but I was advised [facility] and [facility] should. I spoke with patient and let him know. He wanted to use [facility] and I re sent that RX to [facility] in [place]. This medication did need a PA and I submitted this through covermymeds. Awaiting reply.  Patient has enough medication. Will follow up on this on Monday. FYI to PCP

## 2019-03-27 ENCOUNTER — Encounter: Payer: Self-pay | Admitting: Family Medicine

## 2019-03-27 DIAGNOSIS — H029 Unspecified disorder of eyelid: Secondary | ICD-10-CM | POA: Insufficient documentation

## 2019-03-27 DIAGNOSIS — B070 Plantar wart: Secondary | ICD-10-CM | POA: Insufficient documentation

## 2019-03-27 NOTE — Assessment & Plan Note (Signed)
Refer to dermatology 

## 2019-03-27 NOTE — Assessment & Plan Note (Signed)
Benign appearing, we can refer back to the eye clinic if needed in the future.  He will update me as needed.

## 2019-03-27 NOTE — Assessment & Plan Note (Signed)
Refer to dermatology.  Continue clobetasol in the meantime.

## 2019-03-27 NOTE — Telephone Encounter (Signed)
Noted. Thanks.

## 2019-03-27 NOTE — Assessment & Plan Note (Signed)
He can try over-the-counter plaster preparations and update me as needed.

## 2019-03-28 NOTE — Telephone Encounter (Signed)
PA approved for dates 03/25/19-03/24/20. [facility] pharmacy advised by fax. Placed fax from insurance on Dr [employer].

## 2019-03-29 ENCOUNTER — Telehealth: Payer: Self-pay

## 2019-03-29 NOTE — Telephone Encounter (Signed)
Thanks

## 2019-03-29 NOTE — Telephone Encounter (Signed)
LVM w COVID screen and back lab info 1.26.2021 TLJ 

## 2019-03-31 ENCOUNTER — Other Ambulatory Visit: Payer: Self-pay | Admitting: Family Medicine

## 2019-03-31 ENCOUNTER — Other Ambulatory Visit: Payer: Self-pay

## 2019-03-31 ENCOUNTER — Other Ambulatory Visit (INDEPENDENT_AMBULATORY_CARE_PROVIDER_SITE_OTHER): Payer: No Typology Code available for payment source

## 2019-03-31 DIAGNOSIS — E785 Hyperlipidemia, unspecified: Secondary | ICD-10-CM

## 2019-03-31 LAB — CBC WITH DIFFERENTIAL/PLATELET
Basophils Absolute: 0 10*3/uL (ref 0.0–0.1)
Basophils Relative: 0.6 % (ref 0.0–3.0)
Eosinophils Absolute: 0.1 10*3/uL (ref 0.0–0.7)
Eosinophils Relative: 2 % (ref 0.0–5.0)
HCT: 44 % (ref 39.0–52.0)
Hemoglobin: 15 g/dL (ref 13.0–17.0)
Lymphocytes Relative: 33.5 % (ref 12.0–46.0)
Lymphs Abs: 1.9 10*3/uL (ref 0.7–4.0)
MCHC: 34.1 g/dL (ref 30.0–36.0)
MCV: 91.1 fl (ref 78.0–100.0)
Monocytes Absolute: 0.4 10*3/uL (ref 0.1–1.0)
Monocytes Relative: 7.6 % (ref 3.0–12.0)
Neutro Abs: 3.2 10*3/uL (ref 1.4–7.7)
Neutrophils Relative %: 56.3 % (ref 43.0–77.0)
Platelets: 189 10*3/uL (ref 150.0–400.0)
RBC: 4.83 Mil/uL (ref 4.22–5.81)
RDW: 12.5 % (ref 11.5–15.5)
WBC: 5.7 10*3/uL (ref 4.0–10.5)

## 2019-03-31 LAB — COMPREHENSIVE METABOLIC PANEL
ALT: 21 U/L (ref 0–53)
AST: 18 U/L (ref 0–37)
Albumin: 4.5 g/dL (ref 3.5–5.2)
Alkaline Phosphatase: 67 U/L (ref 39–117)
BUN: 17 mg/dL (ref 6–23)
CO2: 31 mEq/L (ref 19–32)
Calcium: 9.5 mg/dL (ref 8.4–10.5)
Chloride: 100 mEq/L (ref 96–112)
Creatinine, Ser: 1 mg/dL (ref 0.40–1.50)
GFR: 84.4 mL/min (ref >60.00)
Glucose, Bld: 87 mg/dL (ref 70–99)
Potassium: 4.7 mEq/L (ref 3.5–5.1)
Sodium: 137 mEq/L (ref 135–145)
Total Bilirubin: 0.6 mg/dL (ref 0.2–1.2)
Total Protein: 7.3 g/dL (ref 6.0–8.3)

## 2019-03-31 LAB — LIPID PANEL
Cholesterol: 237 mg/dL — ABNORMAL HIGH (ref 0–200)
HDL: 42.8 mg/dL (ref >39.00)
NonHDL: 194.31
Total CHOL/HDL Ratio: 6
Triglycerides: 250 mg/dL — ABNORMAL HIGH (ref 0.0–149.0)
VLDL: 50 mg/dL — ABNORMAL HIGH (ref 0.0–40.0)

## 2019-03-31 LAB — LDL CHOLESTEROL, DIRECT: Direct LDL: 162 mg/dL

## 2019-04-03 ENCOUNTER — Encounter: Payer: Self-pay | Admitting: Family Medicine

## 2019-04-03 DIAGNOSIS — E785 Hyperlipidemia, unspecified: Secondary | ICD-10-CM | POA: Insufficient documentation

## 2019-04-05 NOTE — Telephone Encounter (Signed)
Patient stated that this medication was [facility] and it was still very expensive.  He stated that if it is a [place] script then usually those Script are no cost to him  Patient is not sure what he needs to do next .

## 2019-04-05 NOTE — Telephone Encounter (Signed)
Left detailed message on voicemail.  

## 2019-05-06 ENCOUNTER — Telehealth: Payer: Self-pay | Admitting: Family Medicine

## 2019-05-06 NOTE — Telephone Encounter (Signed)
Wanted to keep Dr Para March in the loop for his patient. Mr Destin is a Veteran that we were Authorized to see for Primary Care. We have faxed forms to the VA to request Rheumatology and Dermatology Authorizations also. These requests were faxed on 03/25/2019 and patient is aware.I have spoken to several people from the [facility] that are saying the patient belongs to the [facility] and not the [place].I have also left messages with the [facility] person to call me back. The forms were faxed to the [facility]. Lonna Duval from the [place] told me she can see that the patient has been Authorized for those services however we have not received the Authorizations by fax for the patient to be able to go to these Specialists. Mr Lukacs is also aware of what is going on as I have called him and keep him aware of the situation. He will followup with the VA and will call me back. Hopefully these approvals will be forthcoming soon.

## 2019-05-08 NOTE — Telephone Encounter (Signed)
Noted. Thanks.

## 2019-08-15 ENCOUNTER — Ambulatory Visit (INDEPENDENT_AMBULATORY_CARE_PROVIDER_SITE_OTHER)
Admission: RE | Admit: 2019-08-15 | Discharge: 2019-08-15 | Disposition: A | Discharge status: Home / Self Care | Payer: No Typology Code available for payment source | Source: Ambulatory Visit | Attending: Family Medicine | Admitting: Family Medicine

## 2019-08-15 ENCOUNTER — Encounter: Payer: Self-pay | Admitting: Family Medicine

## 2019-08-15 ENCOUNTER — Ambulatory Visit (INDEPENDENT_AMBULATORY_CARE_PROVIDER_SITE_OTHER): Payer: No Typology Code available for payment source | Admitting: Family Medicine

## 2019-08-15 ENCOUNTER — Other Ambulatory Visit: Payer: Self-pay

## 2019-08-15 VITALS — BP 134/60 | HR 78 | Temp 98.0°F | Ht 73.0 in | Wt 190.2 lb

## 2019-08-15 DIAGNOSIS — M25521 Pain in right elbow: Secondary | ICD-10-CM

## 2019-08-15 DIAGNOSIS — L4050 Arthropathic psoriasis, unspecified: Secondary | ICD-10-CM | POA: Diagnosis not present

## 2019-08-15 NOTE — Patient Instructions (Signed)
Ice as needed.  Xray on the way out.  Try a tennis elbow strap and see if that helps.   Take care.  Glad to see you.
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
## 2019-08-18 DIAGNOSIS — M25521 Pain in right elbow: Secondary | ICD-10-CM | POA: Insufficient documentation

## 2019-08-18 NOTE — Assessment & Plan Note (Signed)
Ice as needed.  Xray on the way out.  Try a tennis elbow strap and see if that helps.

## 2019-08-18 NOTE — Assessment & Plan Note (Signed)
Refer to rheumatology to establish with a new MD.

## 2019-11-21 ENCOUNTER — Ambulatory Visit
Admission: RE | Admit: 2019-11-21 | Discharge: 2019-11-21 | Disposition: A | Discharge status: Home / Self Care | Payer: No Typology Code available for payment source | Source: Ambulatory Visit | Attending: Nurse Practitioner | Admitting: Nurse Practitioner

## 2019-11-21 ENCOUNTER — Other Ambulatory Visit: Payer: Self-pay | Admitting: Nurse Practitioner

## 2019-11-21 ENCOUNTER — Other Ambulatory Visit: Payer: Self-pay

## 2019-11-21 DIAGNOSIS — Z021 Encounter for pre-employment examination: Secondary | ICD-10-CM

## 2020-02-13 ENCOUNTER — Ambulatory Visit
Admission: RE | Admit: 2020-02-13 | Discharge: 2020-02-13 | Disposition: A | Discharge status: Home / Self Care | Payer: No Typology Code available for payment source | Source: Ambulatory Visit | Attending: Nurse Practitioner | Admitting: Nurse Practitioner

## 2020-02-13 ENCOUNTER — Other Ambulatory Visit: Payer: Self-pay | Admitting: Nurse Practitioner

## 2020-02-13 DIAGNOSIS — M25531 Pain in right wrist: Secondary | ICD-10-CM

## 2020-08-01 ENCOUNTER — Telehealth: Payer: Self-pay | Admitting: Family Medicine

## 2020-08-01 NOTE — Telephone Encounter (Signed)
error 

## 2020-08-12 ENCOUNTER — Other Ambulatory Visit: Payer: Self-pay | Admitting: Family Medicine

## 2020-08-12 DIAGNOSIS — E785 Hyperlipidemia, unspecified: Secondary | ICD-10-CM

## 2020-08-14 ENCOUNTER — Other Ambulatory Visit: Payer: Self-pay

## 2020-08-14 ENCOUNTER — Other Ambulatory Visit (INDEPENDENT_AMBULATORY_CARE_PROVIDER_SITE_OTHER): Payer: No Typology Code available for payment source

## 2020-08-14 DIAGNOSIS — E785 Hyperlipidemia, unspecified: Secondary | ICD-10-CM

## 2020-08-14 LAB — CBC WITH DIFFERENTIAL/PLATELET
Basophils Absolute: 0 10*3/uL (ref 0.0–0.1)
Basophils Relative: 0.5 % (ref 0.0–3.0)
Eosinophils Absolute: 0.1 10*3/uL (ref 0.0–0.7)
Eosinophils Relative: 1.8 % (ref 0.0–5.0)
HCT: 42.7 % (ref 39.0–52.0)
Hemoglobin: 14.8 g/dL (ref 13.0–17.0)
Lymphocytes Relative: 28.2 % (ref 12.0–46.0)
Lymphs Abs: 1.6 10*3/uL (ref 0.7–4.0)
MCHC: 34.7 g/dL (ref 30.0–36.0)
MCV: 89.4 fl (ref 78.0–100.0)
Monocytes Absolute: 0.4 10*3/uL (ref 0.1–1.0)
Monocytes Relative: 7.5 % (ref 3.0–12.0)
Neutro Abs: 3.5 10*3/uL (ref 1.4–7.7)
Neutrophils Relative %: 62 % (ref 43.0–77.0)
Platelets: 159 10*3/uL (ref 150.0–400.0)
RBC: 4.78 Mil/uL (ref 4.22–5.81)
RDW: 12.8 % (ref 11.5–15.5)
WBC: 5.6 10*3/uL (ref 4.0–10.5)

## 2020-08-14 LAB — LIPID PANEL
Cholesterol: 205 mg/dL — ABNORMAL HIGH (ref 0–200)
HDL: 34.4 mg/dL — ABNORMAL LOW (ref >39.00)
NonHDL: 170.55
Total CHOL/HDL Ratio: 6
Triglycerides: 218 mg/dL — ABNORMAL HIGH (ref 0.0–149.0)
VLDL: 43.6 mg/dL — ABNORMAL HIGH (ref 0.0–40.0)

## 2020-08-14 LAB — COMPREHENSIVE METABOLIC PANEL
ALT: 18 U/L (ref 0–53)
AST: 18 U/L (ref 0–37)
Albumin: 4.6 g/dL (ref 3.5–5.2)
Alkaline Phosphatase: 67 U/L (ref 39–117)
BUN: 16 mg/dL (ref 6–23)
CO2: 30 mEq/L (ref 19–32)
Calcium: 9.4 mg/dL (ref 8.4–10.5)
Chloride: 102 mEq/L (ref 96–112)
Creatinine, Ser: 1.11 mg/dL (ref 0.40–1.50)
GFR: 84.72 mL/min (ref >60.00)
Glucose, Bld: 89 mg/dL (ref 70–99)
Potassium: 4.5 mEq/L (ref 3.5–5.1)
Sodium: 139 mEq/L (ref 135–145)
Total Bilirubin: 0.6 mg/dL (ref 0.2–1.2)
Total Protein: 8 g/dL (ref 6.0–8.3)

## 2020-08-14 LAB — LDL CHOLESTEROL, DIRECT: Direct LDL: 143 mg/dL

## 2020-08-23 ENCOUNTER — Encounter: Payer: Self-pay | Admitting: Family Medicine

## 2020-08-23 ENCOUNTER — Ambulatory Visit (INDEPENDENT_AMBULATORY_CARE_PROVIDER_SITE_OTHER): Payer: No Typology Code available for payment source | Admitting: Family Medicine

## 2020-08-23 ENCOUNTER — Other Ambulatory Visit: Payer: Self-pay

## 2020-08-23 VITALS — BP 118/80 | HR 70 | Temp 97.9°F | Ht 73.0 in | Wt 189.0 lb

## 2020-08-23 DIAGNOSIS — Z0000 Encounter for general adult medical examination without abnormal findings: Secondary | ICD-10-CM

## 2020-08-23 DIAGNOSIS — L209 Atopic dermatitis, unspecified: Secondary | ICD-10-CM | POA: Insufficient documentation

## 2020-08-23 DIAGNOSIS — L4050 Arthropathic psoriasis, unspecified: Secondary | ICD-10-CM

## 2020-08-23 DIAGNOSIS — E785 Hyperlipidemia, unspecified: Secondary | ICD-10-CM

## 2020-08-23 DIAGNOSIS — S060XAA Concussion with loss of consciousness status unknown, initial encounter: Secondary | ICD-10-CM | POA: Insufficient documentation

## 2020-08-23 DIAGNOSIS — S060X9A Concussion with loss of consciousness of unspecified duration, initial encounter: Secondary | ICD-10-CM | POA: Insufficient documentation

## 2020-08-23 DIAGNOSIS — R5383 Other fatigue: Secondary | ICD-10-CM

## 2020-08-23 DIAGNOSIS — Z7189 Other specified counseling: Secondary | ICD-10-CM

## 2020-08-23 NOTE — Progress Notes (Signed)
This visit occurred during the SARS-CoV-2 public health emergency.  Safety protocols were in place, including screening questions prior to the visit, additional usage of staff PPE, and extensive cleaning of exam room while observing appropriate contact time as indicated for disinfecting solutions.  CPE- See plan.  Routine anticipatory guidance given to patient.  See health maintenance.  The possibility exists that previously documented standard health maintenance information may have been brought forward from a previous encounter into this note.  If needed, that same information has been updated to reflect the current situation based on today's encounter.    Tetanus 2013 Flu d/w pt.  PNA not due Shingles not due.  Covid d/w pt.  PSA and colon screening not due.   Wife designated if patient were incapacitated.   Diet and exercise d/w pt.    He is going to fu with derm about options for his skin.  Currently on clobetasol.    Mildly elevated lipids, but better than prev.  Wouldn't need statin start at this point.  Will continue with diet and exercise.    H/o low T.  D/w pt about recheck at some point.  He has some lower motivation and energy level.    PMH and SH reviewed  Meds, vitals, and allergies reviewed.   ROS: Per HPI.  Unless specifically indicated otherwise in HPI, the patient denies:  General: fever. Eyes: acute vision changes ENT: sore throat Cardiovascular: chest pain Respiratory: SOB GI: vomiting GU: dysuria Musculoskeletal: acute back pain Derm: acute rash Neuro: acute motor dysfunction Psych: worsening mood Endocrine: polydipsia Heme: bleeding Allergy: hayfever  GEN: nad, alert and oriented HEENT: ncat NECK: supple w/o LA CV: rrr. PULM: ctab, no inc wob ABD: soft, +bs EXT: no edema SKIN: papules notes on the forehead.   Normal grip.

## 2020-08-23 NOTE — Patient Instructions (Addendum)
Don't change your meds for now.  Update me as needed.  I would get a flu shot each fall.   Let me know if you need labs done in the interval for outside clinics.   Take care.  Glad to see you.  Get early AM labs when possible.  You don't have to fast.

## 2020-08-26 DIAGNOSIS — Z7189 Other specified counseling: Secondary | ICD-10-CM | POA: Insufficient documentation

## 2020-08-26 NOTE — Assessment & Plan Note (Signed)
Lipids better than previous.  Would continue work on diet and exercise.

## 2020-08-26 NOTE — Assessment & Plan Note (Signed)
We can check TSH and testosterone on the early morning lab draw in the future.  I will await those labs.

## 2020-08-26 NOTE — Assessment & Plan Note (Signed)
Tetanus 2013 Flu d/w pt.  PNA not due Shingles not due.  Covid d/w pt.  PSA and colon screening not due.   Wife designated if patient were incapacitated.   Diet and exercise d/w pt.

## 2020-08-26 NOTE — Assessment & Plan Note (Signed)
He is going to follow-up with dermatology/rheumatology.

## 2020-08-27 ENCOUNTER — Other Ambulatory Visit (INDEPENDENT_AMBULATORY_CARE_PROVIDER_SITE_OTHER): Payer: No Typology Code available for payment source

## 2020-08-27 ENCOUNTER — Other Ambulatory Visit: Payer: Self-pay

## 2020-08-27 DIAGNOSIS — R5383 Other fatigue: Secondary | ICD-10-CM | POA: Diagnosis not present

## 2020-08-27 LAB — TESTOSTERONE: Testosterone: 237.78 ng/dL — ABNORMAL LOW (ref 300.00–890.00)

## 2020-08-27 LAB — TSH: TSH: 1.13 u[IU]/mL (ref 0.35–4.50)

## 2020-08-29 ENCOUNTER — Other Ambulatory Visit: Payer: Self-pay | Admitting: Family Medicine

## 2020-08-29 DIAGNOSIS — E291 Testicular hypofunction: Secondary | ICD-10-CM

## 2020-10-17 ENCOUNTER — Ambulatory Visit: Payer: No Typology Code available for payment source | Admitting: Endocrinology

## 2021-03-13 ENCOUNTER — Ambulatory Visit: Payer: 59

## 2021-03-13 ENCOUNTER — Ambulatory Visit: Payer: Self-pay

## 2021-03-13 ENCOUNTER — Encounter: Payer: Self-pay | Admitting: Physician Assistant

## 2021-03-13 ENCOUNTER — Other Ambulatory Visit: Payer: Self-pay

## 2021-03-13 ENCOUNTER — Ambulatory Visit: Payer: 59 | Admitting: Physician Assistant

## 2021-03-13 VITALS — BP 138/81 | HR 96 | Temp 98.2°F | Resp 12 | Ht 74.0 in | Wt 190.0 lb

## 2021-03-13 DIAGNOSIS — Z021 Encounter for pre-employment examination: Secondary | ICD-10-CM

## 2021-03-13 DIAGNOSIS — G5620 Lesion of ulnar nerve, unspecified upper limb: Secondary | ICD-10-CM | POA: Insufficient documentation

## 2021-03-13 LAB — POCT URINALYSIS DIPSTICK
Bilirubin, UA: NEGATIVE
Blood, UA: NEGATIVE
Glucose, UA: NEGATIVE
Ketones, UA: NEGATIVE
Leukocytes, UA: NEGATIVE
Nitrite, UA: NEGATIVE
Protein, UA: NEGATIVE
Spec Grav, UA: 1.015 (ref 1.010–1.025)
Urobilinogen, UA: 0.2 E.U./dL
pH, UA: 6.5 (ref 5.0–8.0)

## 2021-03-13 NOTE — Progress Notes (Signed)
Pt denies any issues or concerns at this time./CL,RMA   Reviewed CDC recommendations for importance of the COVID-19 Vaccine. Pt has declined the vaccine today and for the future.

## 2021-03-13 NOTE — Progress Notes (Signed)
° °  Subjective:BLET exam    Patient ID: [name] Simpson, male    DOB: 1980/07/11, 41 y.o.   MRN: [id number]  HPI Patient presents for pulmonary exam.  Patient was no concerns or complaints.   Review of Systems Hyperlipidemia    Objective:   Physical Exam No acute distress.  Temperature 98.2, pulse 96, respiration 12, BP is 138/81, patient 98% O2 sat on room air.  Patient weighs 190 pounds.  BMI is 24.39. HEENT is unremarkable.  Neck is supple without lymphadenopathy or bruits.  Lungs are clear to auscultation.  Heart regular rate and rhythm. Abdomen with negative HSM, normoactive bowel sounds, soft, nontender to palpation. No obvious deformity to the upper or lower extremities.  Patient has full and equal range of motion of the upper and lower extremities. No obvious cervical or lumbar spine deformity.  Patient has full and equal range of motion cervical lumbar spine. Cranial nerves II through XII are grossly intact.  DTR 2+ without clonus.       Assessment & Plan: Well exam.   Patient cleared for police training.

## 2021-03-13 NOTE — Progress Notes (Signed)
Pt presents today for pre-employment physical labs.

## 2021-03-16 LAB — CMP12+LP+TP+TSH+6AC+CBC/D/PLT
ALT: 32 IU/L (ref 0–44)
AST: 32 IU/L (ref 0–40)
Albumin/Globulin Ratio: 2 (ref 1.2–2.2)
Albumin: 4.8 g/dL (ref 4.0–5.0)
Alkaline Phosphatase: 73 IU/L (ref 44–121)
BUN/Creatinine Ratio: 9 (ref 9–20)
BUN: 9 mg/dL (ref 6–20)
Basophils Absolute: 0 10*3/uL (ref 0.0–0.2)
Basos: 0 %
Bilirubin Total: 0.4 mg/dL (ref 0.0–1.2)
Calcium: 9.2 mg/dL (ref 8.7–10.2)
Chloride: 100 mmol/L (ref 96–106)
Chol/HDL Ratio: 6.5 ratio — ABNORMAL HIGH (ref 0.0–5.0)
Cholesterol, Total: 240 mg/dL — ABNORMAL HIGH (ref 100–199)
Creatinine, Ser: 1.05 mg/dL (ref 0.76–1.27)
EOS (ABSOLUTE): 0.1 10*3/uL (ref 0.0–0.4)
Eos: 1 %
Estimated CHD Risk: 1.4 times avg. — ABNORMAL HIGH (ref 0.0–1.0)
Free Thyroxine Index: 2 (ref 1.2–4.9)
GGT: 22 IU/L (ref 0–65)
Globulin, Total: 2.4 g/dL (ref 1.5–4.5)
Glucose: 131 mg/dL — ABNORMAL HIGH (ref 70–99)
HDL: 37 mg/dL — ABNORMAL LOW (ref >39)
Hematocrit: 41 % (ref 37.5–51.0)
Hemoglobin: 14.2 g/dL (ref 13.0–17.7)
Immature Grans (Abs): 0 10*3/uL (ref 0.0–0.1)
Immature Granulocytes: 0 %
Iron: 103 ug/dL (ref 38–169)
LDH: 134 IU/L (ref 121–224)
LDL Chol Calc (NIH): 158 mg/dL — ABNORMAL HIGH (ref 0–99)
Lymphocytes Absolute: 1.9 10*3/uL (ref 0.7–3.1)
Lymphs: 33 %
MCH: 31.1 pg (ref 26.6–33.0)
MCHC: 34.6 g/dL (ref 31.5–35.7)
MCV: 90 fL (ref 79–97)
Monocytes Absolute: 0.3 10*3/uL (ref 0.1–0.9)
Monocytes: 5 %
Neutrophils Absolute: 3.5 10*3/uL (ref 1.4–7.0)
Neutrophils: 61 %
Phosphorus: 3.5 mg/dL (ref 2.8–4.1)
Platelets: 186 10*3/uL (ref 150–450)
Potassium: 3.9 mmol/L (ref 3.5–5.2)
RBC: 4.57 x10E6/uL (ref 4.14–5.80)
RDW: 12.7 % (ref 11.6–15.4)
Sodium: 139 mmol/L (ref 134–144)
T3 Uptake Ratio: 28 % (ref 24–39)
T4, Total: 7.1 ug/dL (ref 4.5–12.0)
TSH: 1.24 u[IU]/mL (ref 0.450–4.500)
Total Protein: 7.2 g/dL (ref 6.0–8.5)
Triglycerides: 242 mg/dL — ABNORMAL HIGH (ref 0–149)
Uric Acid: 6.6 mg/dL (ref 3.8–8.4)
VLDL Cholesterol Cal: 45 mg/dL — ABNORMAL HIGH (ref 5–40)
WBC: 5.7 10*3/uL (ref 3.4–10.8)
eGFR: 93 mL/min/{1.73_m2} (ref >59)

## 2021-03-16 LAB — QUANTIFERON-TB GOLD PLUS
QuantiFERON Mitogen Value: >10 IU/mL
QuantiFERON Nil Value: 0 IU/mL
QuantiFERON TB1 Ag Value: 0 IU/mL
QuantiFERON TB2 Ag Value: 0 IU/mL
QuantiFERON-TB Gold Plus: NEGATIVE

## 2021-03-16 LAB — HEPATITIS B SURFACE ANTIBODY,QUALITATIVE: Hep B Surface Ab, Qual: REACTIVE

## 2021-03-20 ENCOUNTER — Encounter: Payer: Self-pay | Admitting: Physician Assistant

## 2021-03-20 ENCOUNTER — Ambulatory Visit: Payer: Self-pay | Admitting: Physician Assistant

## 2021-03-20 ENCOUNTER — Other Ambulatory Visit: Payer: Self-pay

## 2021-03-20 VITALS — BP 154/82 | HR 90 | Temp 98.4°F | Resp 12 | Ht 74.0 in | Wt 190.0 lb

## 2021-03-20 DIAGNOSIS — E785 Hyperlipidemia, unspecified: Secondary | ICD-10-CM

## 2021-03-20 NOTE — Progress Notes (Signed)
° °  Subjective: Elevated lipids    Patient ID: [name] Simpson, male    DOB: April 27, 1980, 41 y.o.   MRN: [id number]  HPI Patient recently had a BLET physical and was noticed that his cholesterol and triglycerides elevated.  Patient relates that he did not fast for the lab test.   Review of Systems Negative septal complaint    Objective:   Physical Exam  Deferred      Assessment & Plan: Elevated lipids   Patient will follow-up fasting labs.

## 2021-03-20 NOTE — Progress Notes (Signed)
Pt presents today for results Post pre-employment physical./CL,RMA

## 2021-07-15 ENCOUNTER — Telehealth: Payer: Self-pay

## 2021-07-15 NOTE — Telephone Encounter (Signed)
Patient called states that he needs to set up appointment with Dr. Damita Dunnings but was told that we need to get approval from [place]?  ? ?Patient was not given a contact number. Was told that we would know what they are talking about.  ?

## 2021-07-16 NOTE — Telephone Encounter (Signed)
I am always glad to see this patient.  The main issue is making sure he has his clinical issue addressed.  Please see what is going on in try to get him scheduled. ? ?There is previous paperwork scanned from 03/07/19 and my understanding is this is about care outside of the [place].   ? ?If he has new paperwork that I need to fill out then let me know.  Thanks. ?

## 2021-07-25 ENCOUNTER — Telehealth: Payer: Self-pay | Admitting: Family Medicine

## 2021-07-25 NOTE — Telephone Encounter (Signed)
Pt called in to know status on referral to see Dr. Para March stated he has paperwork that needs to be fax to the [place] . Requesting a call back #[phone]

## 2021-07-25 NOTE — Telephone Encounter (Signed)
See 07/15/21 TE This is being taken care of

## 2021-07-25 NOTE — Telephone Encounter (Signed)
Form as been printed and put in Dr. Armanda Heritage inbox

## 2021-07-25 NOTE — Telephone Encounter (Signed)
Error message

## 2021-07-25 NOTE — Telephone Encounter (Signed)
I do not handle VA (Non-VA referrals) anymore. These can be done by he Clinical Staff.  I have spoken with Shanda Bumps about this and told her that I would help and walk her through what is needed to be done to take care of these.   Shanda Bumps let me know a good time for me to help you with this.

## 2021-07-25 NOTE — Telephone Encounter (Signed)
SInce [name], CMA  has never done this for a VA Referral I started the process by filling out the forms.  [name] Simpson has been given step by step instructions for completing this process, all forms sent to [place] email to save.   Please print completed form, have Dr Damita Dunnings sign and date, attach most recent OV notes with Dr Damita Dunnings and fax the form to the [facility] - might want to call to verify the fax is correct that's in the email.  Might be helpful for patient to go ahead and schedule his visit about 2 weeks out with Dr Damita Dunnings so that he has something already scheduled so that we can write that appt on the form so they have a "start date" for the approvval/first appt already approved.   Let me know if you have any questions.

## 2021-07-26 NOTE — Telephone Encounter (Signed)
Thanks. I'll work on the hard copy.

## 2021-07-31 NOTE — Telephone Encounter (Signed)
Form has been signed and faxed to [place] with last OV notes. Called and left message for patient that this was done and he could call back for appt when possible. Patient due for CPE end of June.

## 2021-10-04 ENCOUNTER — Other Ambulatory Visit: Payer: Self-pay | Admitting: Family Medicine

## 2021-10-04 DIAGNOSIS — E785 Hyperlipidemia, unspecified: Secondary | ICD-10-CM

## 2021-10-04 DIAGNOSIS — R739 Hyperglycemia, unspecified: Secondary | ICD-10-CM

## 2021-10-14 ENCOUNTER — Other Ambulatory Visit (INDEPENDENT_AMBULATORY_CARE_PROVIDER_SITE_OTHER): Payer: No Typology Code available for payment source

## 2021-10-14 DIAGNOSIS — E785 Hyperlipidemia, unspecified: Secondary | ICD-10-CM

## 2021-10-14 DIAGNOSIS — R739 Hyperglycemia, unspecified: Secondary | ICD-10-CM

## 2021-10-14 LAB — COMPREHENSIVE METABOLIC PANEL
ALT: 27 U/L (ref 0–53)
AST: 23 U/L (ref 0–37)
Albumin: 4.5 g/dL (ref 3.5–5.2)
Alkaline Phosphatase: 68 U/L (ref 39–117)
BUN: 17 mg/dL (ref 6–23)
CO2: 29 mEq/L (ref 19–32)
Calcium: 9.5 mg/dL (ref 8.4–10.5)
Chloride: 102 mEq/L (ref 96–112)
Creatinine, Ser: 1.11 mg/dL (ref 0.40–1.50)
GFR: 84.03 mL/min (ref >60.00)
Glucose, Bld: 90 mg/dL (ref 70–99)
Potassium: 4.7 mEq/L (ref 3.5–5.1)
Sodium: 137 mEq/L (ref 135–145)
Total Bilirubin: 0.6 mg/dL (ref 0.2–1.2)
Total Protein: 7.7 g/dL (ref 6.0–8.3)

## 2021-10-14 LAB — LIPID PANEL
Cholesterol: 221 mg/dL — ABNORMAL HIGH (ref 0–200)
HDL: 36.5 mg/dL — ABNORMAL LOW (ref >39.00)
NonHDL: 184.19
Total CHOL/HDL Ratio: 6
Triglycerides: 285 mg/dL — ABNORMAL HIGH (ref 0.0–149.0)
VLDL: 57 mg/dL — ABNORMAL HIGH (ref 0.0–40.0)

## 2021-10-14 LAB — LDL CHOLESTEROL, DIRECT: Direct LDL: 156 mg/dL

## 2021-10-14 LAB — HEMOGLOBIN A1C: Hgb A1c MFr Bld: 5.7 % (ref 4.6–6.5)

## 2021-10-17 ENCOUNTER — Ambulatory Visit (INDEPENDENT_AMBULATORY_CARE_PROVIDER_SITE_OTHER): Payer: No Typology Code available for payment source | Admitting: Family Medicine

## 2021-10-17 ENCOUNTER — Encounter: Payer: Self-pay | Admitting: Family Medicine

## 2021-10-17 VITALS — BP 132/76 | HR 73 | Temp 97.6°F | Ht 74.0 in | Wt 193.0 lb

## 2021-10-17 DIAGNOSIS — L408 Other psoriasis: Secondary | ICD-10-CM

## 2021-10-17 DIAGNOSIS — Z23 Encounter for immunization: Secondary | ICD-10-CM

## 2021-10-17 DIAGNOSIS — Z7189 Other specified counseling: Secondary | ICD-10-CM

## 2021-10-17 DIAGNOSIS — Z0000 Encounter for general adult medical examination without abnormal findings: Secondary | ICD-10-CM

## 2021-10-17 NOTE — Progress Notes (Signed)
CPE- See plan.  Routine anticipatory guidance given to patient.  See health maintenance.  The possibility exists that previously documented standard health maintenance information may have been brought forward from a previous encounter into this note.  If needed, that same information has been updated to reflect the current situation based on today's encounter.    Tetanus 2023 Flu 2023 PNA not due Shingles not due.  Covid d/w pt.  PSA and colon screening not due.   Wife designated if patient were incapacitated.   Diet and exercise d/w pt.    He wasn't fasting on recent labs.  D/w pt.    Small lesion prev at rectum, prev resolved.  Was smaller than a marble.  No bleeding.  Wasn't painful.  Presumed int hemorrhoid, d/w pt. he does not have any symptoms or palpable lesion to check now.  I asked him to update me as needed.  Anatomy discussed with patient.  Some residual numbness in the R hand but better than prior.    He is putting up with elbow pain/irritation.  He has occ hand pain with grabbing.  He has h/o psoriatic arthritis.  Using clobetasol at baseline for rash.  He is managing as is.  Prev on MTX but had nausea with use.    PMH and SH reviewed  Meds, vitals, and allergies reviewed.   ROS: Per HPI.  Unless specifically indicated otherwise in HPI, the patient denies:  General: fever. Eyes: acute vision changes ENT: sore throat Cardiovascular: chest pain Respiratory: SOB GI: vomiting GU: dysuria Musculoskeletal: acute back pain Derm: acute rash Neuro: acute motor dysfunction Psych: worsening mood Endocrine: polydipsia Heme: bleeding Allergy: hayfever  GEN: nad, alert and oriented HEENT: ncat NECK: supple w/o LA CV: rrr. PULM: ctab, no inc wob ABD: soft, +bs EXT: no edema SKIN: no acute rash noted.

## 2021-10-17 NOTE — Patient Instructions (Signed)
Tetanus and flu shot today.  Update me as needed.  Take care.  Glad to see you.

## 2021-10-20 NOTE — Assessment & Plan Note (Signed)
Tetanus 2023 Flu 2023 PNA not due Shingles not due.  Covid d/w pt.  PSA and colon screening not due.   Wife designated if patient were incapacitated.   Diet and exercise d/w pt.    He wasn't fasting on recent labs.  D/w pt.

## 2021-10-20 NOTE — Assessment & Plan Note (Signed)
Using clobetasol at baseline for rash.  He is managing as is.  Prev on MTX but had nausea with use.   Continue as is.

## 2021-10-20 NOTE — Assessment & Plan Note (Signed)
Wife designated if patient were incapacitated.  

## 2022-01-21 ENCOUNTER — Telehealth: Payer: Self-pay | Admitting: Family Medicine

## 2022-01-21 DIAGNOSIS — L408 Other psoriasis: Secondary | ICD-10-CM

## 2022-01-21 NOTE — Telephone Encounter (Signed)
Pt called asking if Para March can send in the referral for a dermatologist? Pt stated they spoke about it during cpe on 10/17/21. Pt mentioned the referral would have to go the [place] due to insurance purposes. Call back # [phone]

## 2022-01-22 NOTE — Telephone Encounter (Signed)
I put in the referral locally.  Let me know if he has trouble getting that set up.  Thanks.

## 2022-01-22 NOTE — Telephone Encounter (Signed)
Thank you.  I misunderstood that.  I corrected it  and put in a new order.

## 2022-01-22 NOTE — Telephone Encounter (Signed)
Spoke with patient. He states the referral needs to be sent to the [place] to go through their screening process in order to be approved. He says he's been through this before and if the referral doesn't get sent to the [place] then it wont be covered through insurance.

## 2022-01-22 NOTE — Addendum Note (Signed)
Addended by: Joaquim Nam on: 01/22/2022 04:07 PM   Modules accepted: Orders

## 2022-01-30 ENCOUNTER — Encounter: Payer: Self-pay | Admitting: *Deleted

## 2022-01-30 NOTE — Telephone Encounter (Signed)
[name] Simpson, This referral may have to be printed and faxed to the [place] once I find out exactly where he is wanting to be referred if I am unable to pull up in Epic.   I sent him a Mychart message trying to obtain information for the referral.   Like if he is already scheduled somewhere?  Does he have a Provider name or location that he is wanting this referral sent?   When I reviewed the [facility] website, I did not see a Dermatologist listed as a Specialty service they offer. I have no idea where to send his referral, there is no Provider name, location or fax/phone listed in this note.  I tried calling him but my [employer]) phone is not letting me make outbound calls right now.   If you could possibly assist in helping me obtain this information that would be great! Otherwise, I will wait on him to respond back to my Mychart message.  Thanks!

## 2022-01-30 NOTE — Telephone Encounter (Signed)
Gotcha. I wanted to make sure I was understanding correctly and didn't want to leave anything untouched. Thanks for clearing that up for me.   Still waiting on the patient to get back to me on how and where to send the referral.

## 2022-03-18 DIAGNOSIS — Z7729 Contact with and (suspected ) exposure to other hazardous substances: Secondary | ICD-10-CM | POA: Diagnosis not present

## 2022-04-21 ENCOUNTER — Ambulatory Visit: Payer: Self-pay

## 2022-04-21 DIAGNOSIS — Z0000 Encounter for general adult medical examination without abnormal findings: Secondary | ICD-10-CM

## 2022-04-21 LAB — POCT URINALYSIS DIPSTICK
Bilirubin, UA: NEGATIVE
Blood, UA: NEGATIVE
Glucose, UA: NEGATIVE
Ketones, UA: NEGATIVE
Leukocytes, UA: NEGATIVE
Nitrite, UA: NEGATIVE
Protein, UA: NEGATIVE
Spec Grav, UA: 1.025 (ref 1.010–1.025)
Urobilinogen, UA: 0.2 E.U./dL
pH, UA: 6 (ref 5.0–8.0)

## 2022-04-21 NOTE — Progress Notes (Signed)
Pt presents today to complete lap portion of physical./CL,RMA

## 2022-04-22 LAB — CMP12+LP+TP+TSH+6AC+CBC/D/PLT
ALT: 33 IU/L (ref 0–44)
AST: 28 IU/L (ref 0–40)
Albumin/Globulin Ratio: 1.5 (ref 1.2–2.2)
Albumin: 4.3 g/dL (ref 4.1–5.1)
Alkaline Phosphatase: 70 IU/L (ref 44–121)
BUN/Creatinine Ratio: 15 (ref 9–20)
BUN: 15 mg/dL (ref 6–20)
Basophils Absolute: 0 10*3/uL (ref 0.0–0.2)
Basos: 1 %
Bilirubin Total: 0.5 mg/dL (ref 0.0–1.2)
Calcium: 9.2 mg/dL (ref 8.7–10.2)
Chloride: 98 mmol/L (ref 96–106)
Chol/HDL Ratio: 6.4 ratio — ABNORMAL HIGH (ref 0.0–5.0)
Cholesterol, Total: 229 mg/dL — ABNORMAL HIGH (ref 100–199)
Creatinine, Ser: 0.98 mg/dL (ref 0.76–1.27)
EOS (ABSOLUTE): 0.1 10*3/uL (ref 0.0–0.4)
Eos: 2 %
Estimated CHD Risk: 1.3 times avg. — ABNORMAL HIGH (ref 0.0–1.0)
Free Thyroxine Index: 1.9 (ref 1.2–4.9)
GGT: 21 IU/L (ref 0–65)
Globulin, Total: 2.8 g/dL (ref 1.5–4.5)
Glucose: 78 mg/dL (ref 70–99)
HDL: 36 mg/dL — ABNORMAL LOW (ref >39)
Hematocrit: 41.6 % (ref 37.5–51.0)
Hemoglobin: 14.4 g/dL (ref 13.0–17.7)
Immature Grans (Abs): 0 10*3/uL (ref 0.0–0.1)
Immature Granulocytes: 0 %
Iron: 171 ug/dL — ABNORMAL HIGH (ref 38–169)
LDH: 137 IU/L (ref 121–224)
LDL Chol Calc (NIH): 139 mg/dL — ABNORMAL HIGH (ref 0–99)
Lymphocytes Absolute: 1.8 10*3/uL (ref 0.7–3.1)
Lymphs: 36 %
MCH: 31.2 pg (ref 26.6–33.0)
MCHC: 34.6 g/dL (ref 31.5–35.7)
MCV: 90 fL (ref 79–97)
Monocytes Absolute: 0.4 10*3/uL (ref 0.1–0.9)
Monocytes: 9 %
Neutrophils Absolute: 2.6 10*3/uL (ref 1.4–7.0)
Neutrophils: 52 %
Phosphorus: 2.1 mg/dL — ABNORMAL LOW (ref 2.8–4.1)
Platelets: 161 10*3/uL (ref 150–450)
Potassium: 4.3 mmol/L (ref 3.5–5.2)
RBC: 4.61 x10E6/uL (ref 4.14–5.80)
RDW: 12.6 % (ref 11.6–15.4)
Sodium: 134 mmol/L (ref 134–144)
T3 Uptake Ratio: 29 % (ref 24–39)
T4, Total: 6.7 ug/dL (ref 4.5–12.0)
TSH: 1.57 u[IU]/mL (ref 0.450–4.500)
Total Protein: 7.1 g/dL (ref 6.0–8.5)
Triglycerides: 298 mg/dL — ABNORMAL HIGH (ref 0–149)
Uric Acid: 8 mg/dL (ref 3.8–8.4)
VLDL Cholesterol Cal: 54 mg/dL — ABNORMAL HIGH (ref 5–40)
WBC: 5 10*3/uL (ref 3.4–10.8)
eGFR: 101 mL/min/{1.73_m2} (ref >59)

## 2022-04-28 ENCOUNTER — Ambulatory Visit: Payer: Self-pay | Admitting: Physician Assistant

## 2022-04-28 ENCOUNTER — Encounter: Payer: Self-pay | Admitting: Physician Assistant

## 2022-04-28 VITALS — BP 131/80 | HR 97 | Temp 98.2°F | Resp 12 | Ht 74.0 in | Wt 195.0 lb

## 2022-04-28 DIAGNOSIS — Z0000 Encounter for general adult medical examination without abnormal findings: Secondary | ICD-10-CM

## 2022-04-28 DIAGNOSIS — E785 Hyperlipidemia, unspecified: Secondary | ICD-10-CM

## 2022-04-28 MED ORDER — ROSUVASTATIN CALCIUM 40 MG PO TABS: 40.0000 mg | ORAL_TABLET | Freq: Every day | ORAL | 3 refills | Status: DC

## 2022-04-28 NOTE — Progress Notes (Signed)
[facility]  {70 ____________________________________________   None    (approximate)  I have reviewed the triage vital signs and the nursing notes.   HISTORY  Chief Complaint Annual Exam    HPI [name] Simpson is a 42 y.o. male patient presents for annual physical exam.  Patient was no concerns or complaints.  Patient advised of elevated lipid profile.         Past Medical History:  Diagnosis Date   Alcoholism in family    Hydrocele, left 04/2010   Seen on testicular US, small   Psoriasis    per Dr. Allyson Sabal   Psoriatic arthritis [facility])    per Dr. Charlestine Night   PTSD (post-traumatic stress disorder)    Ulnar nerve injury     Patient Active Problem List   Diagnosis Date Noted   Cubital tunnel syndrome 03/13/2021   Advance care planning 08/26/2020   Concussion 08/23/2020   Atopic dermatitis, unspecified 08/23/2020   Right elbow pain 08/18/2019   HLD (hyperlipidemia) 04/03/2019   Plantar wart 03/27/2019   Psoriatic arthritis ([place]) 07/02/2015   Routine general medical examination at a health care facility 03/21/2011   Fatigue 03/21/2011   PSORIASIS 04/12/2010    Past Surgical History:  Procedure Laterality Date   FB removed, right arm  2008   h/o ulnar nerve effects in hand - numbness   MOUTH SURGERY  Age 24    Prior to Admission medications   Medication Sig Start Date End Date Taking? Authorizing Provider  clobetasol cream (TEMOVATE) [id number] % Apply 1 Application topically daily.   Yes [provider]    Allergies Methotrexate derivatives  Family History  Problem Relation Age of Onset   Irritable bowel syndrome Mother    Hypertension Mother    Alcohol abuse Father    Cirrhosis Father    Liver cancer Father    Diabetes Maternal Grandfather    Cancer Paternal Grandmother        colon CA, colon polyps   Diabetes Paternal Grandmother    Colon cancer Paternal Grandmother    Alcohol abuse Other     Hyperlipidemia Other    Hypertension Other    Heart disease Other    Hypertension Other    Stroke Other    Prostate cancer Neg Hx     Social History Social History   Tobacco Use   Smoking status: Never   Smokeless tobacco: Never  Vaping Use   Vaping Use: Never used  Substance Use Topics   Alcohol use: Yes    Alcohol/week: 0.0 standard drinks of alcohol    Comment: very rarely   Drug use: No    Review of Systems Constitutional: No fever/chills Eyes: No visual changes. ENT: No sore throat. Cardiovascular: Denies chest pain. Respiratory: Denies shortness of breath. Gastrointestinal: No abdominal pain.  No nausea, no vomiting.  No diarrhea.  No constipation. Genitourinary: Negative for dysuria. Musculoskeletal: Negative for back pain. Skin: Negative for rash. Neurological: Negative for headaches, focal weakness or numbness. Psychiatric: PTSD Endocrine: Hyperlipidemia ____________________________________________   PHYSICAL EXAM:  VITAL SIGNS: BP is 131/80, pulse 97, respiration 12, temperature 98.2, patient 96% O2 sat on room air.  Patient weighs 195 pounds and BMI is 25.04. onstitutional: Alert and oriented. Well appearing and in no acute distress. Eyes: Conjunctivae are normal. PERRL. EOMI. Head: Atraumatic. Nose: No congestion/rhinnorhea. Mouth/Throat: Mucous membranes are moist.  Oropharynx non-erythematous. Neck: No stridor.  No cervical spine tenderness to palpation this is a  symmetric set up and just go back and use the general abdomen he states physical exam is all set up.  All systems 87 which he did not Hematological/Lymphatic/Immunilogical: No cervical lymphadenopathy. Cardiovascular: Normal rate, regular rhythm. Grossly normal heart sounds.  Good peripheral circulation. Respiratory: Normal respiratory effort.  No retractions. Lungs CTAB. Gastrointestinal: Soft and nontender. No distention. No abdominal bruits. No CVA tenderness. Genitourinary:  Deferred Musculoskeletal: No lower extremity tenderness nor edema.  No joint effusions. Neurologic:  Normal speech and language. No gross focal neurologic deficits are appreciated. No gait instability. Skin:  Skin is warm, dry and intact. No rash noted. Psychiatric: Mood and affect are normal. Speech and behavior are normal.  ____________________________________________   LABS       Component Ref Range & Units 7 d ago 1 yr ago  Color, UA  yellow yellow  Clarity, UA  clear clear  Glucose, UA Negative Negative Negative  Bilirubin, UA  neg negative  Ketones, UA  neg negative  Spec Grav, UA 1.010 - 1.025 1.025 1.015  Blood, UA  neg negative  pH, UA 5.0 - 8.0 6.0 6.5  Protein, UA Negative Negative Negative  Urobilinogen, UA 0.2 or 1.0 E.U./dL 0.2 0.2  Nitrite, UA  neg negative  Leukocytes, UA Negative Negative Negative  Appearance   light  Odor            Specimen Collected: 04/21/22 15:57 Last Resulted: 04/21/22 15:57      Lab Flowsheet      Order Details      View Encounter      Lab and Collection Details      Routing      Result History    View All Conversations on this Encounter        Result Care Coordination   Patient Communication   Add Comments   Seen Back to Top      Other Results from 04/21/2022   Contains abnormal data CMP12+LP+TP+TSH+6AC+CBC/D/Plt Order: [id number] Status: Final result      Visible to patient: Yes (seen)      Next appt: 11/18/2022 at 03:30 PM in Dermatology Brendolyn Patty, MD)      Dx: Routine adult health maintenance    0 Result Notes               Component Ref Range & Units 7 d ago (04/21/22) 6 mo ago (10/14/21) 6 mo ago (10/14/21) 1 yr ago (03/13/21) 1 yr ago (08/27/20) 1 yr ago (08/14/20) 1 yr ago (08/14/20) 1 yr ago (08/14/20)  Glucose 70 - 99 mg/dL 78 90  131 High   89    Uric Acid 3.8 - 8.4 mg/dL 8.0   6.6 CM      Comment:            Therapeutic target for gout patients: <6.0  BUN 6 - 20 mg/dL 15 17 R  9  16 R     Creatinine, Ser 0.76 - 1.27 mg/dL 0.98 1.11 R  1.05  1.11 R    eGFR >59 mL/min/1.73 101   93      BUN/Creatinine Ratio 9 - '20 15   9      '$ Sodium 134 - 144 mmol/L 134 137 R  139  139 R    Potassium 3.5 - 5.2 mmol/L 4.3 4.7 R  3.9  4.5 R    Chloride 96 - 106 mmol/L 98 102 R  100  102 R    Calcium 8.7 - 10.2 mg/dL  9.2 9.5 R  9.2  9.4 R    Phosphorus 2.8 - 4.1 mg/dL 2.1 Low    3.5      Total Protein 6.0 - 8.5 g/dL 7.1 7.7 R  7.2  8.0 R    Albumin 4.1 - 5.1 g/dL 4.3 4.5 R  4.8 R  4.6 R    Globulin, Total 1.5 - 4.5 g/dL 2.8   2.4      Albumin/Globulin Ratio 1.2 - 2.2 1.5   2.0      Bilirubin Total 0.0 - 1.2 mg/dL 0.5 0.6 R  0.4  0.6 R    Alkaline Phosphatase 44 - 121 IU/L 70 68 R  73  67 R    LDH 121 - 224 IU/L 137   134      AST 0 - 40 IU/L 28 23 R  32  18 R    ALT 0 - 44 IU/L 33 27 R  32  18 R    GGT 0 - 65 IU/L 21   22      Iron 38 - 169 ug/dL 171 High    103      Cholesterol, Total 100 - 199 mg/dL 229 High    240 High       Triglycerides 0 - 149 mg/dL 298 High   285.0 High  R, CM 242 High    218.0 High  R, CM   HDL >39 mg/dL 36 Low   36.50 Low  R 37 Low    34.40 Low  R   VLDL Cholesterol Cal 5 - 40 mg/dL 54 High    45 High       LDL Chol Calc (NIH) 0 - 99 mg/dL 139 High    158 High       Chol/HDL Ratio 0.0 - 5.0 ratio 6.4 High   6 R, CM 6.5 High  CM   6 R, CM   Comment:                                   T. Chol/HDL Ratio                                             Men  Women                               1/2 Avg.Risk  3.4    3.3                                   Avg.Risk  5.0    4.4                                2X Avg.Risk  9.6    7.1                                3X Avg.Risk 23.4   11.0  Estimated CHD Risk 0.0 - 1.0 times avg. 1.3 High    1.4 High  CM      Comment: The CHD Risk is  based on the T. Chol/HDL ratio. Other factors affect CHD Risk such as hypertension, smoking, diabetes, severe obesity, and family history of premature CHD.  TSH 0.450 - 4.500 uIU/mL 1.570   1.240  1.13 R     T4, Total 4.5 - 12.0 ug/dL 6.7   7.1      T3 Uptake Ratio 24 - 39 % 29   28      Free Thyroxine Index 1.2 - 4.9 1.9   2.0      WBC 3.4 - 10.8 x10E3/uL 5.0   5.7    5.6 R  RBC 4.14 - 5.80 x10E6/uL 4.61   4.57    4.78 R  Hemoglobin 13.0 - 17.7 g/dL 14.4   14.2    14.8 R  Hematocrit 37.5 - 51.0 % 41.6   41.0    42.7 R  MCV 79 - 97 fL 90   90    89.4 R  MCH 26.6 - 33.0 pg 31.2   31.1      MCHC 31.5 - 35.7 g/dL 34.6   34.6    34.7 R  RDW 11.6 - 15.4 % 12.6   12.7    12.8 R  Platelets 150 - 450 x10E3/uL 161   186    159.0 R  Neutrophils Not Estab. % 52   61    62.0 R  Lymphs Not Estab. % 36   33      Monocytes Not Estab. % 9   5      Eos Not Estab. % 2   1      Basos Not Estab. % 1   0      Neutrophils Absolute 1.4 - 7.0 x10E3/uL 2.6   3.5    3.5 R  Lymphocytes Absolute 0.7 - 3.1 x10E3/uL 1.8   1.9    1.6 R  Monocytes Absolute 0.1 - 0.9 x10E3/uL 0.4   0.3      EOS (ABSOLUTE) 0.0 - 0.4 x10E3/uL 0.1   0.1      Basophils Absolute 0.0 - 0.2 x10E3/uL 0.0   0.0    0.0 R  Immature Granulocytes Not Estab. % 0   0                      ____________________________________________   ____________________________________________    ____________________________________________   INITIAL IMPRESSION / ASSESSMENT AND PLAN  As part of my medical decision making, I reviewed the following data within the electronic MEDICAL RECORD NUMBER      No acute findings on physical exam.  Discussed elevated lipid with patient.  Patient amenable to starting a statin for 45-monthfasting follow-up.        ____________________________________________   FINAL CLINICAL IMPRESSION'@EDCI'$ @ Well exam  ED Discharge Orders     None        Note:  This document was prepared using Dragon voice recognition software and may include unintentional dictation errors.

## 2022-04-28 NOTE — Progress Notes (Signed)
Pt presents today to complete physical, no concerns or issues at this time/CL,RMA
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
## 2022-09-17 DIAGNOSIS — Z125 Encounter for screening for malignant neoplasm of prostate: Secondary | ICD-10-CM | POA: Diagnosis not present

## 2022-09-17 DIAGNOSIS — Z1329 Encounter for screening for other suspected endocrine disorder: Secondary | ICD-10-CM | POA: Diagnosis not present

## 2022-09-17 DIAGNOSIS — E291 Testicular hypofunction: Secondary | ICD-10-CM | POA: Diagnosis not present

## 2022-09-29 DIAGNOSIS — E7849 Other hyperlipidemia: Secondary | ICD-10-CM | POA: Diagnosis not present

## 2022-09-29 DIAGNOSIS — E291 Testicular hypofunction: Secondary | ICD-10-CM | POA: Diagnosis not present

## 2022-09-29 DIAGNOSIS — R5382 Chronic fatigue, unspecified: Secondary | ICD-10-CM | POA: Diagnosis not present

## 2022-09-29 DIAGNOSIS — L409 Psoriasis, unspecified: Secondary | ICD-10-CM | POA: Diagnosis not present

## 2022-09-29 DIAGNOSIS — G479 Sleep disorder, unspecified: Secondary | ICD-10-CM | POA: Diagnosis not present

## 2022-09-29 DIAGNOSIS — R6882 Decreased libido: Secondary | ICD-10-CM | POA: Diagnosis not present

## 2022-09-29 DIAGNOSIS — Z6825 Body mass index (BMI) 25.0-25.9, adult: Secondary | ICD-10-CM | POA: Diagnosis not present

## 2022-09-29 DIAGNOSIS — M2550 Pain in unspecified joint: Secondary | ICD-10-CM | POA: Diagnosis not present

## 2022-10-23 ENCOUNTER — Ambulatory Visit: Payer: Self-pay | Admitting: Physician Assistant

## 2022-10-23 ENCOUNTER — Encounter: Payer: Self-pay | Admitting: Physician Assistant

## 2022-10-23 VITALS — BP 129/72 | HR 82 | Temp 98.6°F | Resp 12

## 2022-10-23 DIAGNOSIS — R5383 Other fatigue: Secondary | ICD-10-CM

## 2022-10-23 DIAGNOSIS — Z77118 Contact with and (suspected) exposure to other environmental pollution: Secondary | ICD-10-CM | POA: Insufficient documentation

## 2022-10-23 DIAGNOSIS — Z7729 Contact with and (suspected ) exposure to other hazardous substances: Secondary | ICD-10-CM | POA: Insufficient documentation

## 2022-10-23 LAB — POC COVID19 BINAXNOW: SARS Coronavirus 2 Ag: NEGATIVE

## 2022-10-23 MED ORDER — PROMETHAZINE-DM 6.25-15 MG/5ML PO SYRP: 5.0000 mL | ORAL_SOLUTION | Freq: Four times a day (QID) | ORAL | 0 refills | Status: DC | PRN

## 2022-10-23 MED ORDER — AZITHROMYCIN 250 MG PO TABS: ORAL_TABLET | ORAL | 0 refills | Status: AC

## 2022-10-23 NOTE — Progress Notes (Signed)
S/Sx started this past Friday evening: Fatigue Chest congestion Cough - productive at times - green/yellow phegm Occ headaches Denies ear discormfort, N/V/D, teeth/jaw discomfort Sore throat - able to swallow Bodyaches Unsure of fever b/c of taking ibuprofen/acetaminophen  Took OTC cold & cough & Sudafed  States S/sx have progressively gotten worse  Two daughters sick - bacterial infections & treated with Zpack  AMD

## 2022-10-23 NOTE — Progress Notes (Signed)
   Subjective:    Patient ID: [name] Simpson, male    DOB: 1980/11/14, 42 y.o.   MRN: [id number]  HPI Cough and congestion for 4 days.  Patient has complained of bodyaches and fatigue.  Patient states his 2 daughters were treated for respiratory infection which was bacterial in nature approximately a week ago.  Patient denies recent travel or known contact with COVID-19.  Patient has negative COVID-19 in clinic today.   Review of Systems Hyperlipidemia and psoriasis arthritis    Objective:   Physical Exam BP 129/72  BP Location Left Arm  Patient Position Sitting  Cuff Size Large  Pulse 82  Resp 12  Temp 98.6 F (37 C)  Temp src Temporal  SpO2 97 %   Smoking status Never  Smokeless status Never  No acute distress.  HEENT is unremarkable.  Neck is supple for lymphadenopathy or bruits.  Lungs had bilateral rhonchi breath sounds.  Heart is regular rate and rhythm.       Assessment & Plan: Bronchitis  Patient given a prescription for Z-Pak and Phenergan DM.  Advised Tylenol for fever or muscle aches.  Follow-up with no improvement in 5 days.

## 2022-10-31 DIAGNOSIS — E291 Testicular hypofunction: Secondary | ICD-10-CM | POA: Diagnosis not present

## 2022-10-31 DIAGNOSIS — Z79890 Hormone replacement therapy (postmenopausal): Secondary | ICD-10-CM | POA: Diagnosis not present

## 2022-11-18 ENCOUNTER — Ambulatory Visit: Payer: No Typology Code available for payment source | Admitting: Dermatology

## 2022-12-24 DIAGNOSIS — Z6824 Body mass index (BMI) 24.0-24.9, adult: Secondary | ICD-10-CM | POA: Diagnosis not present

## 2022-12-24 DIAGNOSIS — M2550 Pain in unspecified joint: Secondary | ICD-10-CM | POA: Diagnosis not present

## 2022-12-24 DIAGNOSIS — R5382 Chronic fatigue, unspecified: Secondary | ICD-10-CM | POA: Diagnosis not present

## 2022-12-24 DIAGNOSIS — G479 Sleep disorder, unspecified: Secondary | ICD-10-CM | POA: Diagnosis not present

## 2022-12-24 DIAGNOSIS — Z79890 Hormone replacement therapy (postmenopausal): Secondary | ICD-10-CM | POA: Diagnosis not present

## 2022-12-24 DIAGNOSIS — E291 Testicular hypofunction: Secondary | ICD-10-CM | POA: Diagnosis not present

## 2023-01-18 ENCOUNTER — Other Ambulatory Visit: Payer: Self-pay | Admitting: Family Medicine

## 2023-01-18 DIAGNOSIS — E785 Hyperlipidemia, unspecified: Secondary | ICD-10-CM

## 2023-01-19 ENCOUNTER — Other Ambulatory Visit (INDEPENDENT_AMBULATORY_CARE_PROVIDER_SITE_OTHER): Payer: 59

## 2023-01-19 DIAGNOSIS — E785 Hyperlipidemia, unspecified: Secondary | ICD-10-CM | POA: Diagnosis not present

## 2023-01-19 LAB — COMPREHENSIVE METABOLIC PANEL
ALT: 23 U/L (ref 0–53)
AST: 23 U/L (ref 0–37)
Albumin: 4.4 g/dL (ref 3.5–5.2)
Alkaline Phosphatase: 67 U/L (ref 39–117)
BUN: 12 mg/dL (ref 6–23)
CO2: 30 meq/L (ref 19–32)
Calcium: 9.2 mg/dL (ref 8.4–10.5)
Chloride: 101 meq/L (ref 96–112)
Creatinine, Ser: 1.04 mg/dL (ref 0.40–1.50)
GFR: 90.06 mL/min (ref >60.00)
Glucose, Bld: 82 mg/dL (ref 70–99)
Potassium: 4.4 meq/L (ref 3.5–5.1)
Sodium: 138 meq/L (ref 135–145)
Total Bilirubin: 0.5 mg/dL (ref 0.2–1.2)
Total Protein: 7.1 g/dL (ref 6.0–8.3)

## 2023-01-19 LAB — LIPID PANEL
Cholesterol: 228 mg/dL — ABNORMAL HIGH (ref 0–200)
HDL: 33.1 mg/dL — ABNORMAL LOW (ref >39.00)
LDL Cholesterol: 133 mg/dL — ABNORMAL HIGH (ref 0–99)
NonHDL: 194.61
Total CHOL/HDL Ratio: 7
Triglycerides: 310 mg/dL — ABNORMAL HIGH (ref 0.0–149.0)
VLDL: 62 mg/dL — ABNORMAL HIGH (ref 0.0–40.0)

## 2023-01-26 ENCOUNTER — Ambulatory Visit (INDEPENDENT_AMBULATORY_CARE_PROVIDER_SITE_OTHER): Payer: 59 | Admitting: Family Medicine

## 2023-01-26 ENCOUNTER — Encounter: Payer: Self-pay | Admitting: Family Medicine

## 2023-01-26 VITALS — BP 128/74 | HR 86 | Temp 98.4°F | Ht 74.5 in | Wt 205.8 lb

## 2023-01-26 DIAGNOSIS — E785 Hyperlipidemia, unspecified: Secondary | ICD-10-CM

## 2023-01-26 DIAGNOSIS — L408 Other psoriasis: Secondary | ICD-10-CM | POA: Diagnosis not present

## 2023-01-26 DIAGNOSIS — Z23 Encounter for immunization: Secondary | ICD-10-CM

## 2023-01-26 DIAGNOSIS — R0989 Other specified symptoms and signs involving the circulatory and respiratory systems: Secondary | ICD-10-CM | POA: Diagnosis not present

## 2023-01-26 DIAGNOSIS — Z0000 Encounter for general adult medical examination without abnormal findings: Secondary | ICD-10-CM

## 2023-01-26 DIAGNOSIS — Z7189 Other specified counseling: Secondary | ICD-10-CM

## 2023-01-26 MED ORDER — METOPROLOL TARTRATE 25 MG PO TABS: 12.5000 mg | ORAL_TABLET | Freq: Two times a day (BID) | ORAL | 1 refills | Status: DC | PRN

## 2023-01-26 MED ORDER — CLOBETASOL PROPIONATE 0.05 % EX CREA: 1.0000 | TOPICAL_CREAM | Freq: Every day | CUTANEOUS | 2 refills | Status: AC | PRN

## 2023-01-26 NOTE — Patient Instructions (Signed)
We'll update you about the other labs.  Refer to cards- zio patch pending in the meantime.  It likely makes sense to get the cardiology appointment after the patch is done.  Use metoprolol in the meantime if needed.  Take care.  Glad to see you. I wouldn't increase caffeine.

## 2023-01-26 NOTE — Progress Notes (Unsigned)
CPE- See plan.  Routine anticipatory guidance given to patient.  See health maintenance.  The possibility exists that previously documented standard health maintenance information may have been brought forward from a previous encounter into this note.  If needed, that same information has been updated to reflect the current situation based on today's encounter.    Tetanus 2023 Flu 2024 PNA not due Shingles not due.  PSA and colon screening not due.   Wife designated if patient were incapacitated.   Diet and exercise d/w pt.     HLD.  D/w pt.  Low ASCVD.  He had brain fog on crestor.    He had episode of irregular pulse, he had irregular rate on his apple watch.  He had 1 lead records on his phone with irregular pulse.  It lasted over 1 hour but less than 2 hours.    Mood is better with adherence to diet and exercise.    Psoriasis/psoriatic arthritis.  Joint pain has been less problematic overall.  Elbow rash, usually worse in the wintertime.  Using clobetasol prn.    PMH and SH reviewed  Meds, vitals, and allergies reviewed.   ROS: Per HPI.  Unless specifically indicated otherwise in HPI, the patient denies:  General: fever. Eyes: acute vision changes ENT: sore throat Cardiovascular: chest pain Respiratory: SOB GI: vomiting GU: dysuria Musculoskeletal: acute back pain Derm: acute rash Neuro: acute motor dysfunction Psych: worsening mood Endocrine: polydipsia Heme: bleeding Allergy: hayfever  GEN: nad, alert and oriented HEENT: mucous membranes moist NECK: supple w/o LA CV: rrr. PULM: ctab, no inc wob ABD: soft, +bs EXT: no edema SKIN: no acute rash  The 10-year ASCVD risk score (Arnett DK, et al., 2019) is: 2.6%   Values used to calculate the score:     Age: 42 years     Sex: Male     Is Non-Hispanic African American: No     Diabetic: No     Tobacco smoker: No     Systolic Blood Pressure: 128 mmHg     Is BP treated: No     HDL Cholesterol: 33.1 mg/dL      Total Cholesterol: 228 mg/dL

## 2023-01-27 ENCOUNTER — Ambulatory Visit: Payer: No Typology Code available for payment source | Attending: Family Medicine

## 2023-01-27 DIAGNOSIS — R0989 Other specified symptoms and signs involving the circulatory and respiratory systems: Secondary | ICD-10-CM

## 2023-01-27 LAB — CBC WITH DIFFERENTIAL/PLATELET
Basophils Absolute: 0.1 10*3/uL (ref 0.0–0.1)
Basophils Relative: 0.8 % (ref 0.0–3.0)
Eosinophils Absolute: 0.3 10*3/uL (ref 0.0–0.7)
Eosinophils Relative: 2.6 % (ref 0.0–5.0)
HCT: 47.8 % (ref 39.0–52.0)
Hemoglobin: 16 g/dL (ref 13.0–17.0)
Lymphocytes Relative: 23.3 % (ref 12.0–46.0)
Lymphs Abs: 2.4 10*3/uL (ref 0.7–4.0)
MCHC: 33.6 g/dL (ref 30.0–36.0)
MCV: 93.9 fL (ref 78.0–100.0)
Monocytes Absolute: 0.8 10*3/uL (ref 0.1–1.0)
Monocytes Relative: 7.7 % (ref 3.0–12.0)
Neutro Abs: 6.8 10*3/uL (ref 1.4–7.7)
Neutrophils Relative %: 65.6 % (ref 43.0–77.0)
Platelets: 190 10*3/uL (ref 150.0–400.0)
RBC: 5.08 Mil/uL (ref 4.22–5.81)
RDW: 12.9 % (ref 11.5–15.5)
WBC: 10.4 10*3/uL (ref 4.0–10.5)

## 2023-01-27 LAB — TSH: TSH: 1.7 u[IU]/mL (ref 0.35–5.50)

## 2023-01-29 DIAGNOSIS — R0989 Other specified symptoms and signs involving the circulatory and respiratory systems: Secondary | ICD-10-CM | POA: Insufficient documentation

## 2023-01-29 NOTE — Assessment & Plan Note (Signed)
Low ASCVD.  He had brain fog on crestor, did not tolerate the medication and return to baseline with cessation.Marland Kitchen

## 2023-01-29 NOTE — Assessment & Plan Note (Signed)
Tetanus 2023 Flu 2024 PNA not due Shingles not due.  PSA and colon screening not due.   Wife designated if patient were incapacitated.   Diet and exercise d/w pt.

## 2023-01-29 NOTE — Assessment & Plan Note (Signed)
Wife designated if patient were incapacitated.  

## 2023-01-29 NOTE — Assessment & Plan Note (Signed)
Joint pain has been less problematic overall.  Elbow rash, usually worse in the wintertime.  Using clobetasol prn.

## 2023-01-29 NOTE — Assessment & Plan Note (Signed)
The concern is for potential A-fib.  EKG without acute changes today.  We need more information for definitive diagnosis.  Discussed options. Seen notes on follow-up labs. Refer to cards- zio patch pending in the meantime.  It likely makes sense to get the cardiology appointment after the patch is done.  Use metoprolol in the meantime if needed.  Routine cautions given to patient. I wouldn't increase caffeine.  He agrees with plan.

## 2023-02-22 ENCOUNTER — Other Ambulatory Visit: Payer: Self-pay | Admitting: Family Medicine

## 2023-02-23 DIAGNOSIS — E291 Testicular hypofunction: Secondary | ICD-10-CM | POA: Diagnosis not present

## 2023-02-23 DIAGNOSIS — Z79890 Hormone replacement therapy (postmenopausal): Secondary | ICD-10-CM | POA: Diagnosis not present

## 2023-03-02 DIAGNOSIS — E291 Testicular hypofunction: Secondary | ICD-10-CM | POA: Diagnosis not present

## 2023-03-02 DIAGNOSIS — G479 Sleep disorder, unspecified: Secondary | ICD-10-CM | POA: Diagnosis not present

## 2023-03-02 DIAGNOSIS — I4891 Unspecified atrial fibrillation: Secondary | ICD-10-CM | POA: Diagnosis not present

## 2023-03-02 DIAGNOSIS — M2550 Pain in unspecified joint: Secondary | ICD-10-CM | POA: Diagnosis not present

## 2023-03-02 DIAGNOSIS — L409 Psoriasis, unspecified: Secondary | ICD-10-CM | POA: Diagnosis not present

## 2023-03-02 DIAGNOSIS — Z6826 Body mass index (BMI) 26.0-26.9, adult: Secondary | ICD-10-CM | POA: Diagnosis not present

## 2023-03-02 DIAGNOSIS — Z79890 Hormone replacement therapy (postmenopausal): Secondary | ICD-10-CM | POA: Diagnosis not present

## 2023-03-02 DIAGNOSIS — R6882 Decreased libido: Secondary | ICD-10-CM | POA: Diagnosis not present

## 2023-04-10 NOTE — Progress Notes (Signed)
 Cardiology Office Note  Date:  04/13/2023   ID:  SAY FRIPP, DOB 08/04/1980, MRN [id number]  PCP:  Donnie Galea, MD   Chief Complaint  Patient presents with   New Patient (Initial Visit)    Ref by Dr. Vallarie Gauze for irregular heart beats & discuss Zio monitor results. Patient c/o shortness of breath with having the irregularity and occasional vertigo.     HPI:  Mr. [name] Simpson is a 43 year old gentleman with past medical history of Hyperlipidemia, family history coronary disease, MI Paroxysmal atrial fibrillation, pauses Who presents by referral from Dr. Vallarie Gauze for consultation of his irregular pulse, paroxysmal atrial fibrillation  On discussion he reports having tachypalpitations dating back several months Able to gait irregular pulse dating back to October 2024 Several episodes per month, very symptomatic when he has episodes  Episodes of arrhythmia confirmed by his Apple Watch  Zio monitor reviewed  confirming atrial fibrillation 12% burden, also PACs, PVCs  Was started on metoprolol  to tartrate as needed by primary care Most episodes seem to happen overnight Denies having symptoms concerning for sleep apnea  Lab work reviewed Total cholesterol 228  Family hx: Mat GF: CAD, MI Father GM: CAD MI  EKG personally reviewed by myself on todays visit EKG Interpretation Date/Time:  Monday April 13 2023 09:38:28 EST Ventricular Rate:  75 PR Interval:  142 QRS Duration:  84 QT Interval:  356 QTC Calculation: 397 R Axis:   69  Text Interpretation: Normal sinus rhythm with sinus arrhythmia Normal ECG When compared with ECG of 26-Jan-2016 17:39, No significant change was found Confirmed by Belva Boyden [phone]) on 04/13/2023 9:56:25 AM    PMH:   has a past medical history of Alcoholism in family, Hydrocele, left (04/2010), Psoriasis, Psoriatic arthritis (HCC), PTSD (post-traumatic stress disorder), and Ulnar nerve injury.  PSH:    Past Surgical History:   Procedure Laterality Date   FB removed, right arm  2008   h/o ulnar nerve effects in hand - numbness   MOUTH SURGERY  Age 68    Current Outpatient Medications  Medication Sig Dispense Refill   clobetasol  cream (TEMOVATE ) 0.05 % Apply 1 Application topically daily as needed. 60 g 2   metoprolol  succinate (TOPROL -XL) 25 MG 24 hr tablet Take 1 tablet (25 mg total) by mouth daily. Take with or immediately following a meal. 90 tablet 3   metoprolol  tartrate (LOPRESSOR ) 25 MG tablet Take 0.5-1 tablets (12.5-25 mg total) by mouth 2 (two) times daily as needed (for elevated pulse). 60 tablet 1   No current facility-administered medications for this visit.    Allergies:   Crestor  [rosuvastatin ] and Methotrexate derivatives   Social History:  The patient  reports that he has never smoked. He has never used smokeless tobacco. He reports current alcohol use. He reports that he does not use drugs.   Family History:   family history includes Alcohol abuse in his father and another family member; Cancer in his paternal grandmother; Cirrhosis in his father; Colon cancer in his paternal grandmother; Diabetes in his maternal grandfather and paternal grandmother; Heart disease in an other family member; Hyperlipidemia in an other family member; Hypertension in his mother and other family members; Irritable bowel syndrome in his mother; Liver cancer in his father; Stroke in an other family member.    Review of Systems: Review of Systems  Constitutional: Negative.   HENT: Negative.    Respiratory: Negative.    Cardiovascular: Negative.   Gastrointestinal: Negative.   Musculoskeletal:  Negative.   Neurological: Negative.   Psychiatric/Behavioral: Negative.    All other systems reviewed and are negative.    PHYSICAL EXAM: VS:  BP 120/72 (BP Location: Left Arm, Patient Position: Sitting, Cuff Size: Normal)   Pulse 75   Ht 6\' 3"  (1.905 m)   Wt 201 lb (91.2 kg)   SpO2 98%   BMI 25.12 kg/m  , BMI  Body mass index is 25.12 kg/m. GEN: Well nourished, well developed, in no acute distress HEENT: normal Neck: no JVD, carotid bruits, or masses Cardiac: RRR; no murmurs, rubs, or gallops,no edema  Respiratory:  clear to auscultation bilaterally, normal work of breathing GI: soft, nontender, nondistended, + BS MS: no deformity or atrophy Skin: warm and dry, no rash Neuro:  Strength and sensation are intact Psych: euthymic mood, full affect  Recent Labs: 01/19/2023: ALT 23; BUN 12; Creatinine, Ser 1.04; Potassium 4.4; Sodium 138 01/26/2023: Hemoglobin 16.0; Platelets 190.0; TSH 1.70    Lipid Panel Lab Results  Component Value Date   CHOL 228 (H) 01/19/2023   HDL 33.10 (L) 01/19/2023   LDLCALC 133 (H) 01/19/2023   TRIG 310.0 (H) 01/19/2023      Wt Readings from Last 3 Encounters:  04/13/23 201 lb (91.2 kg)  01/26/23 205 lb 12.8 oz (93.4 kg)  04/28/22 195 lb (88.5 kg)     ASSESSMENT AND PLAN:  Problem List Items Addressed This Visit       Cardiology Problems   HLD (hyperlipidemia)   Relevant Medications   metoprolol  succinate (TOPROL -XL) 25 MG 24 hr tablet   Other Relevant Orders   CT CARDIAC SCORING (SELF PAY ONLY)     Other   Pulse irregularity - Primary   Relevant Orders   EKG 12-Lead (Completed)   Other Visit Diagnoses       Paroxysmal atrial fibrillation (HCC)       Relevant Medications   metoprolol  succinate (TOPROL -XL) 25 MG 24 hr tablet   Other Relevant Orders   ECHOCARDIOGRAM COMPLETE      Paroxysmal atrial fibrillation Long discussion concerning symptoms, mechanism of atrial fibrillation We have suggested starting metoprolol  succinate 25 mg daily for rate and rhythm control.  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 Vallarie Gauze and will be referred back to his office for ongoing care of the issues detailed above   Signed, Juanda Noon, M.D., Ph.D. [facility] [place], [place] [phone]

## 2023-04-13 ENCOUNTER — Ambulatory Visit: Payer: 59 | Attending: Cardiovascular Disease | Admitting: Cardiovascular Disease

## 2023-04-13 ENCOUNTER — Encounter: Payer: Self-pay | Admitting: Cardiovascular Disease

## 2023-04-13 VITALS — BP 120/72 | HR 75 | Ht 75.0 in | Wt 201.0 lb

## 2023-04-13 DIAGNOSIS — R0989 Other specified symptoms and signs involving the circulatory and respiratory systems: Secondary | ICD-10-CM

## 2023-04-13 DIAGNOSIS — I480 Paroxysmal atrial fibrillation: Secondary | ICD-10-CM

## 2023-04-13 DIAGNOSIS — E782 Mixed hyperlipidemia: Secondary | ICD-10-CM | POA: Diagnosis not present

## 2023-04-13 MED ORDER — METOPROLOL SUCCINATE ER 25 MG PO TB24: 25.0000 mg | ORAL_TABLET | Freq: Every day | ORAL | 3 refills | Status: AC

## 2023-04-13 NOTE — Patient Instructions (Addendum)
 Medication Instructions:  Please start metoprolol  succinate 25 mg daily Take metoprolol  tartrate 25 mg as needed for breakthrough afib  Call if metoprolol  succinate 50 mg is needed   If you need a refill on your cardiac medications before your next appointment, please call your pharmacy.   Lab work: No new labs needed  Testing/Procedures: Your physician has requested that you have an echocardiogram. Echocardiography is a painless test that uses sound waves to create images of your heart. It provides your doctor with information about the size and shape of your heart and how well your heart's chambers and valves are working.   You may receive an ultrasound enhancing agent through an IV if needed to better visualize your heart during the echo. This procedure takes approximately one hour.  There are no restrictions for this procedure.  This will take place at [address] [facility] [address]) [address], [place] [address]  Please note: We ask at that you not bring children with you during ultrasound (echo/ vascular) testing. Due to room size and safety concerns, children are not allowed in the ultrasound rooms during exams. Our front office staff cannot provide observation of children in our lobby area while testing is being conducted. An adult accompanying a patient to their appointment will only be allowed in the ultrasound room at the discretion of the ultrasound technician under special circumstances. We apologize for any inconvenience.   Call if you would like a calcium  scoring, $99 CT coronary calcium  score.   - $99 out of pocket cost at the time of your test - Call [phone] to schedule at your convenience.  [facility] [address] [place], [place] [address]   Coronary CalciumScan A coronary calcium  scan is an imaging test used to look for deposits of calcium  and other fatty materials (plaques) in the inner lining of the blood  vessels of the heart (coronary arteries). These deposits of calcium  and plaques can partly clog and narrow the coronary arteries without producing any symptoms or warning signs. This puts a person at risk for a heart attack. This test can detect these deposits before symptoms develop. Tell a health care provider about: Any allergies you have. All medicines you are taking, including vitamins, herbs, eye drops, creams, and over-the-counter medicines. Any problems you or family members have had with anesthetic medicines. Any blood disorders you have. Any surgeries you have had. Any medical conditions you have. Whether you are pregnant or may be pregnant. What are the risks? Generally, this is a safe procedure. However, problems may occur, including: Harm to a pregnant woman and her unborn baby. This test involves the use of radiation. Radiation exposure can be dangerous to a pregnant woman and her unborn baby. If you are pregnant, you generally should not have this procedure done. Slight increase in the risk of cancer. This is because of the radiation involved in the test. What happens before the procedure? No preparation is needed for this procedure. What happens during the procedure? You will undress and remove any jewelry around your neck or chest. You will put on a hospital gown. Sticky electrodes will be placed on your chest. The electrodes will be connected to an electrocardiogram (ECG) machine to record a tracing of the electrical activity of your heart. A CT scanner will take pictures of your heart. During this time, you will be asked to lie still and hold your breath for 2-3 seconds while a picture of your heart  is being taken. The procedure may vary among health care providers and hospitals. What happens after the procedure? You can get dressed. You can return to your normal activities. It is up to you to get the results of your test. Ask your health care provider, or the department  that is doing the test, when your results will be ready. Summary A coronary calcium  scan is an imaging test used to look for deposits of calcium  and other fatty materials (plaques) in the inner lining of the blood vessels of the heart (coronary arteries). Generally, this is a safe procedure. Tell your health care provider if you are pregnant or may be pregnant. No preparation is needed for this procedure. A CT scanner will take pictures of your heart. You can return to your normal activities after the scan is done. This information is not intended to replace advice given to you by your health care provider. Make sure you discuss any questions you have with your health care provider. Document Released: 08/16/2007 Document Revised: 01/07/2016 Document Reviewed: 01/07/2016 Elsevier Interactive Patient Education  2017 [employer].   Follow-Up: At [facility], you and your health needs are our priority.  As part of our continuing mission to provide you with exceptional heart care, we have created designated Provider Care Teams.  These Care Teams include your primary Cardiologist (physician) and Advanced Practice Providers (APPs -  Physician Assistants and Nurse Practitioners) who all work together to provide you with the care you need, when you need it.  You will need a follow up appointment in 3 months  Providers on your designated Care Team:   Laneta Pintos, NP Varney Gentleman, PA-C Cadence Gennaro Khat, [place]  COVID-19 Vaccine Information can be found at: [email or web address] For questions related to vaccine distribution or appointments, please email [email or web address] or call [phone].

## 2023-04-14 ENCOUNTER — Ambulatory Visit
Admission: RE | Admit: 2023-04-14 | Discharge: 2023-04-14 | Disposition: A | Discharge status: Home / Self Care | Payer: Self-pay | Source: Ambulatory Visit | Attending: Cardiovascular Disease | Admitting: Cardiovascular Disease

## 2023-04-14 DIAGNOSIS — E782 Mixed hyperlipidemia: Secondary | ICD-10-CM | POA: Insufficient documentation

## 2023-04-17 ENCOUNTER — Encounter: Payer: Self-pay | Admitting: Cardiovascular Disease

## 2023-05-04 ENCOUNTER — Encounter: Payer: Self-pay | Admitting: Family Medicine

## 2023-05-04 DIAGNOSIS — E291 Testicular hypofunction: Secondary | ICD-10-CM | POA: Diagnosis not present

## 2023-05-04 DIAGNOSIS — I480 Paroxysmal atrial fibrillation: Secondary | ICD-10-CM | POA: Insufficient documentation

## 2023-05-06 DIAGNOSIS — I4891 Unspecified atrial fibrillation: Secondary | ICD-10-CM | POA: Diagnosis not present

## 2023-05-06 DIAGNOSIS — E7849 Other hyperlipidemia: Secondary | ICD-10-CM | POA: Diagnosis not present

## 2023-05-06 DIAGNOSIS — E291 Testicular hypofunction: Secondary | ICD-10-CM | POA: Diagnosis not present

## 2023-05-06 DIAGNOSIS — G479 Sleep disorder, unspecified: Secondary | ICD-10-CM | POA: Diagnosis not present

## 2023-05-06 DIAGNOSIS — M2550 Pain in unspecified joint: Secondary | ICD-10-CM | POA: Diagnosis not present

## 2023-05-06 DIAGNOSIS — Z6825 Body mass index (BMI) 25.0-25.9, adult: Secondary | ICD-10-CM | POA: Diagnosis not present

## 2023-05-06 DIAGNOSIS — R5382 Chronic fatigue, unspecified: Secondary | ICD-10-CM | POA: Diagnosis not present

## 2023-05-11 ENCOUNTER — Ambulatory Visit: Payer: 59 | Attending: Cardiovascular Disease

## 2023-05-11 DIAGNOSIS — I480 Paroxysmal atrial fibrillation: Secondary | ICD-10-CM

## 2023-05-11 LAB — ECHOCARDIOGRAM COMPLETE
AV Mean grad: 4 mmHg
AV Peak grad: 8.8 mmHg
Ao pk vel: 1.48 m/s
Area-P 1/2: 3.6 cm2
S' Lateral: 3.1 cm

## 2023-05-13 ENCOUNTER — Ambulatory Visit: Payer: No Typology Code available for payment source | Admitting: Dermatology

## 2023-05-17 ENCOUNTER — Encounter: Payer: Self-pay | Admitting: Cardiovascular Disease

## 2023-06-12 ENCOUNTER — Telehealth: Payer: Self-pay | Admitting: Cardiovascular Disease

## 2023-06-12 NOTE — Telephone Encounter (Signed)
 They are calling to get records to approve heart monitor.  Records through 12/24-2/25  Please fax to [phone]

## 2023-06-30 ENCOUNTER — Telehealth: Payer: Self-pay | Admitting: Cardiovascular Disease

## 2023-06-30 NOTE — Telephone Encounter (Signed)
 Left voicemail to advise patient that his appt on 5/12 may need to be rescheduled. Will call to confirm the night before appt if the provider will be out of office.

## 2023-07-12 NOTE — Progress Notes (Deleted)
 Cardiology Office Note  Date:  07/12/2023   ID:  [name] Simpson, DOB 1980/04/13, MRN [id number]  PCP:  Donnie Galea, MD   No chief complaint on file.   HPI:  Mr. [name] Simpson is a 43 year old gentleman with past medical history of Hyperlipidemia, family history coronary disease, MI Paroxysmal atrial fibrillation, pauses Coronary calcium  score of 0.  Who presents for f/u of his paroxysmal atrial fibrillation   Echocardiogram Normal left and right ventricular size and function No significant valvular heart disease Normal pressures estimated   On discussion he reports having tachypalpitations dating back several months Able to gait irregular pulse dating back to October 2024 Several episodes per month, very symptomatic when he has episodes  Episodes of arrhythmia confirmed by his Apple Watch  Zio monitor reviewed  confirming atrial fibrillation 12% burden, also PACs, PVCs  Was started on metoprolol  to tartrate as needed by primary care Most episodes seem to happen overnight Denies having symptoms concerning for sleep apnea  Lab work reviewed Total cholesterol 228  Family hx: Mat GF: CAD, MI Father GM: CAD MI  EKG personally reviewed by myself on todays visit      PMH:   has a past medical history of Alcoholism in family, Hydrocele, left (04/2010), Psoriasis, Psoriatic arthritis (HCC), PTSD (post-traumatic stress disorder), and Ulnar nerve injury.  PSH:    Past Surgical History:  Procedure Laterality Date   FB removed, right arm  2008   h/o ulnar nerve effects in hand - numbness   MOUTH SURGERY  Age 61    Current Outpatient Medications  Medication Sig Dispense Refill   clobetasol  cream (TEMOVATE ) 0.05 % Apply 1 Application topically daily as needed. 60 g 2   metoprolol  succinate (TOPROL -XL) 25 MG 24 hr tablet Take 1 tablet (25 mg total) by mouth daily. Take with or immediately following a meal. 90 tablet 3   metoprolol  tartrate (LOPRESSOR ) 25 MG tablet  Take 0.5-1 tablets (12.5-25 mg total) by mouth 2 (two) times daily as needed (for elevated pulse). 60 tablet 1   No current facility-administered medications for this visit.    Allergies:   Crestor  [rosuvastatin ] and Methotrexate derivatives   Social History:  The patient  reports that he has never smoked. He has never used smokeless tobacco. He reports current alcohol use. He reports that he does not use drugs.   Family History:   family history includes Alcohol abuse in his father and another family member; Cancer in his paternal grandmother; Cirrhosis in his father; Colon cancer in his paternal grandmother; Diabetes in his maternal grandfather and paternal grandmother; Heart disease in an other family member; Hyperlipidemia in an other family member; Hypertension in his mother and other family members; Irritable bowel syndrome in his mother; Liver cancer in his father; Stroke in an other family member.    Review of Systems: Review of Systems  Constitutional: Negative.   HENT: Negative.    Respiratory: Negative.    Cardiovascular: Negative.   Gastrointestinal: Negative.   Musculoskeletal: Negative.   Neurological: Negative.   Psychiatric/Behavioral: Negative.    All other systems reviewed and are negative.    PHYSICAL EXAM: VS:  There were no vitals taken for this visit. , BMI There is no height or weight on file to calculate BMI. GEN: Well nourished, well developed, in no acute distress HEENT: normal Neck: no JVD, carotid bruits, or masses Cardiac: RRR; no murmurs, rubs, or gallops,no edema  Respiratory:  clear to auscultation bilaterally, normal work  of breathing GI: soft, nontender, nondistended, + BS MS: no deformity or atrophy Skin: warm and dry, no rash Neuro:  Strength and sensation are intact Psych: euthymic mood, full affect  Recent Labs: 01/19/2023: ALT 23; BUN 12; Creatinine, Ser 1.04; Potassium 4.4; Sodium 138 01/26/2023: Hemoglobin 16.0; Platelets 190.0; TSH  1.70    Lipid Panel Lab Results  Component Value Date   CHOL 228 (H) 01/19/2023   HDL 33.10 (L) 01/19/2023   LDLCALC 133 (H) 01/19/2023   TRIG 310.0 (H) 01/19/2023      Wt Readings from Last 3 Encounters:  04/13/23 201 lb (91.2 kg)  01/26/23 205 lb 12.8 oz (93.4 kg)  04/28/22 195 lb (88.5 kg)     ASSESSMENT AND PLAN:  Problem List Items Addressed This Visit   None   Paroxysmal atrial fibrillation Long discussion concerning symptoms, mechanism of atrial fibrillation We have suggested starting metoprolol  succinate 25 mg daily for rate and rhythm control.  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 Vallarie Gauze and will be referred back to his office for ongoing care of the issues detailed above   Signed, Juanda Noon, M.D., Ph.D. [facility] [place], [place] [phone]

## 2023-07-13 ENCOUNTER — Ambulatory Visit: Payer: 59 | Attending: Cardiovascular Disease | Admitting: Cardiovascular Disease

## 2023-07-31 DIAGNOSIS — Z77118 Contact with and (suspected) exposure to other environmental pollution: Secondary | ICD-10-CM | POA: Insufficient documentation

## 2023-07-31 DIAGNOSIS — Z77098 Contact with and (suspected) exposure to other hazardous, chiefly nonmedicinal, chemicals: Secondary | ICD-10-CM | POA: Insufficient documentation

## 2023-07-31 DIAGNOSIS — M659 Unspecified synovitis and tenosynovitis, unspecified site: Secondary | ICD-10-CM | POA: Insufficient documentation

## 2023-08-03 ENCOUNTER — Ambulatory Visit: Payer: Self-pay

## 2023-08-03 DIAGNOSIS — Z0000 Encounter for general adult medical examination without abnormal findings: Secondary | ICD-10-CM

## 2023-08-03 LAB — POCT URINALYSIS DIPSTICK
Bilirubin, UA: NEGATIVE
Blood, UA: NEGATIVE
Glucose, UA: NEGATIVE
Ketones, UA: NEGATIVE
Leukocytes, UA: NEGATIVE
Nitrite, UA: NEGATIVE
Protein, UA: NEGATIVE
Spec Grav, UA: <=1.005 — AB (ref 1.010–1.025)
Urobilinogen, UA: 0.2 U/dL
pH, UA: 7.5 (ref 5.0–8.0)

## 2023-08-03 NOTE — Progress Notes (Signed)
Pt completed labs for scheduled physical. [name] Simpson

## 2023-08-05 LAB — CMP12+LP+TP+TSH+6AC+PSA+CBC…
ALT: 25 IU/L (ref 0–44)
AST: 30 IU/L (ref 0–40)
Albumin: 4.2 g/dL (ref 4.1–5.1)
Alkaline Phosphatase: 83 IU/L (ref 44–121)
BUN/Creatinine Ratio: 13 (ref 9–20)
BUN: 13 mg/dL (ref 6–24)
Basophils Absolute: 0 10*3/uL (ref 0.0–0.2)
Basos: 1 %
Bilirubin Total: 0.8 mg/dL (ref 0.0–1.2)
Calcium: 9.5 mg/dL (ref 8.7–10.2)
Chloride: 101 mmol/L (ref 96–106)
Chol/HDL Ratio: 5.6 ratio — ABNORMAL HIGH (ref 0.0–5.0)
Cholesterol, Total: 235 mg/dL — ABNORMAL HIGH (ref 100–199)
Creatinine, Ser: 1.02 mg/dL (ref 0.76–1.27)
EOS (ABSOLUTE): 0.1 10*3/uL (ref 0.0–0.4)
Eos: 3 %
Estimated CHD Risk: 1.2 times avg. — ABNORMAL HIGH (ref 0.0–1.0)
Free Thyroxine Index: 2.3 (ref 1.2–4.9)
GGT: 17 IU/L (ref 0–65)
Globulin, Total: 3.3 g/dL (ref 1.5–4.5)
Glucose: 84 mg/dL (ref 70–99)
HDL: 42 mg/dL (ref >39)
Hematocrit: 48.7 % (ref 37.5–51.0)
Hemoglobin: 16 g/dL (ref 13.0–17.7)
Immature Grans (Abs): 0 10*3/uL (ref 0.0–0.1)
Immature Granulocytes: 0 %
Iron: 195 ug/dL — ABNORMAL HIGH (ref 38–169)
LDH: 129 IU/L (ref 121–224)
LDL Chol Calc (NIH): 152 mg/dL — ABNORMAL HIGH (ref 0–99)
Lymphocytes Absolute: 1.9 10*3/uL (ref 0.7–3.1)
Lymphs: 39 %
MCH: 30.9 pg (ref 26.6–33.0)
MCHC: 32.9 g/dL (ref 31.5–35.7)
MCV: 94 fL (ref 79–97)
Monocytes Absolute: 0.3 10*3/uL (ref 0.1–0.9)
Monocytes: 7 %
Neutrophils Absolute: 2.4 10*3/uL (ref 1.4–7.0)
Neutrophils: 50 %
Phosphorus: 3.4 mg/dL (ref 2.8–4.1)
Platelets: 166 10*3/uL (ref 150–450)
Potassium: 4.4 mmol/L (ref 3.5–5.2)
Prostate Specific Ag, Serum: 0.6 ng/mL (ref 0.0–4.0)
RBC: 5.17 x10E6/uL (ref 4.14–5.80)
RDW: 12.5 % (ref 11.6–15.4)
Sodium: 143 mmol/L (ref 134–144)
T3 Uptake Ratio: 30 % (ref 24–39)
T4, Total: 7.5 ug/dL (ref 4.5–12.0)
TSH: 1.36 u[IU]/mL (ref 0.450–4.500)
Total Protein: 7.5 g/dL (ref 6.0–8.5)
Triglycerides: 221 mg/dL — ABNORMAL HIGH (ref 0–149)
Uric Acid: 5.7 mg/dL (ref 3.8–8.4)
VLDL Cholesterol Cal: 41 mg/dL — ABNORMAL HIGH (ref 5–40)
WBC: 4.7 10*3/uL (ref 3.4–10.8)
eGFR: 95 mL/min/{1.73_m2} (ref >59)

## 2023-08-10 ENCOUNTER — Encounter: Payer: Self-pay | Admitting: Physician Assistant

## 2023-08-10 ENCOUNTER — Ambulatory Visit: Payer: Self-pay | Admitting: Physician Assistant

## 2023-08-10 VITALS — BP 125/88 | HR 64 | Temp 98.0°F | Resp 14 | Wt 202.0 lb

## 2023-08-10 DIAGNOSIS — Z0000 Encounter for general adult medical examination without abnormal findings: Secondary | ICD-10-CM

## 2023-08-10 NOTE — Progress Notes (Signed)
 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  Denies any complaints.

## 2023-08-10 NOTE — Progress Notes (Signed)
 [facility]  ____________________________________________   None    (approximate)  I have reviewed the triage vital signs and the nursing notes.   HISTORY  Chief Complaint No chief complaint on file.   HPI [name] Simpson is a 43 y.o. male patient presents for annual physical exam.  Patient voices no concerns or complaints.         Past Medical History:  Diagnosis Date   Alcoholism in family    Hydrocele, left 04/2010   Seen on testicular US , small   Psoriasis    per Dr. Fleurette Humbles   Psoriatic arthritis [facility])    per Dr. Bernadine Briar   PTSD (post-traumatic stress disorder)    Ulnar nerve injury     Patient Active Problem List   Diagnosis Date Noted   Exposure to environmental pollution 07/31/2023   Synovitis 07/31/2023   Exposure to potentially hazardous chemical 07/31/2023   PAF (paroxysmal atrial fibrillation) (HCC) 05/04/2023   Pulse irregularity 01/29/2023   Contact with and (suspected) exposure to other environmental pollution 10/23/2022   Exposure to potentially hazardous substance 10/23/2022   Cubital tunnel syndrome 03/13/2021   Advance care planning 08/26/2020   Concussion 08/23/2020   Atopic dermatitis, unspecified 08/23/2020   Right elbow pain 08/18/2019   HLD (hyperlipidemia) 04/03/2019   Psoriatic arthritis (HCC) 07/02/2015   Routine general medical examination at a health care facility 03/21/2011   PSORIASIS 04/12/2010    Past Surgical History:  Procedure Laterality Date   FB removed, right arm  2008   h/o ulnar nerve effects in hand - numbness   MOUTH SURGERY  Age 54    Prior to Admission medications   Medication Sig Start Date End Date Taking? Authorizing Provider  clobetasol  cream (TEMOVATE ) 0.05 % Apply 1 Application topically daily as needed. 01/26/23   Donnie Galea, MD  metoprolol  succinate (TOPROL -XL) 25 MG 24 hr tablet Take 1 tablet (25 mg total) by mouth daily. Take with or immediately following a  meal. 04/13/23 07/12/23  Gollan, Timothy J, MD  metoprolol  tartrate (LOPRESSOR ) 25 MG tablet Take 0.5-1 tablets (12.5-25 mg total) by mouth 2 (two) times daily as needed (for elevated pulse). 01/26/23   Donnie Galea, MD    Allergies Crestor  [rosuvastatin ] and Methotrexate derivatives  Family History  Problem Relation Age of Onset   Irritable bowel syndrome Mother    Hypertension Mother    Alcohol abuse Father    Cirrhosis Father    Liver cancer Father    Diabetes Maternal Grandfather    Cancer Paternal Grandmother        colon CA, colon polyps   Diabetes Paternal Grandmother    Colon cancer Paternal Grandmother    Alcohol abuse Other    Hyperlipidemia Other    Hypertension Other    Heart disease Other    Hypertension Other    Stroke Other    Prostate cancer Neg Hx     Social History Social History   Tobacco Use   Smoking status: Never   Smokeless tobacco: Never  Vaping Use   Vaping status: Never Used  Substance Use Topics   Alcohol use: Yes    Alcohol/week: 0.0 standard drinks of alcohol    Comment: very rarely   Drug use: No    Review of Systems Constitutional: No fever/chills Eyes: No visual changes. ENT: No sore throat. Cardiovascular: Denies chest pain.  PAF Respiratory: Denies shortness of breath. Gastrointestinal: No abdominal pain.  No nausea, no  vomiting.  No diarrhea.  No constipation. Genitourinary: Negative for dysuria. Musculoskeletal: Negative for back pain. Skin: Negative for rash. Neurological: Negative for headaches, focal weakness or numbness. Psychiatric PTSD Allergic/Immunilogical: Crestor  ____________________________________________   PHYSICAL EXAM:  VITAL SIGNS: BP 125/88  Pulse Rate 64  Temp 98 F (36.7 C)  Weight 202 lb (91.6 kg)  Resp 14  SpO2 96 %   BMI: 25.25 kg/m2  BSA: 2.20 m2    Constitutional: Alert and oriented. Well appearing and in no acute distress. Eyes: Conjunctivae are normal. PERRL. EOMI. Head:  Atraumatic. Nose: No congestion/rhinnorhea. Mouth/Throat: Mucous membranes are moist.  Oropharynx non-erythematous. Neck: No stridor.  No cervical spine tenderness to palpation. Hematological/Lymphatic/Immunilogical: No cervical lymphadenopathy. Cardiovascular: Normal rate, regular rhythm. Grossly normal heart sounds.  Good peripheral circulation. Respiratory: Normal respiratory effort.  No retractions. Lungs CTAB. Gastrointestinal: Soft and nontender. No distention. No abdominal bruits. No CVA tenderness. Genitourinary: Deferred Musculoskeletal: No lower extremity tenderness nor edema.  No joint effusions. Neurologic:  Normal speech and language. No gross focal neurologic deficits are appreciated. No gait instability. Skin:  Skin is warm, dry and intact. No rash noted. Psychiatric: Mood and affect are normal. Speech and behavior are normal.  ____________________________________________   LABS        Component Ref Range & Units (hover) 7 d ago 1 yr ago 2 yr ago  Color, UA light yellow yellow yellow  Clarity, UA clear clear clear  Glucose, UA Negative Negative Negative  Bilirubin, UA neg neg negative  Ketones, UA neg neg negative  Spec Grav, UA <=1.005 Abnormal  1.025 1.015  Blood, UA neg neg negative  pH, UA 7.5 6.0 6.5  Protein, UA Negative Negative Negative  Urobilinogen, UA 0.2 0.2 0.2  Nitrite, UA neg neg negative  Leukocytes, UA Negative Negative Negative  Appearance   light  Odor                    Component Ref Range & Units (hover) 7 d ago (08/03/23) 6 mo ago (01/26/23) 6 mo ago (01/26/23) 6 mo ago (01/19/23) 6 mo ago (01/19/23) 1 yr ago (04/21/22) 1 yr ago (10/14/21) 1 yr ago (10/14/21)  Glucose 84   82  78 90   Uric Acid 5.7     8.0 CM    Comment:            Therapeutic target for gout patients: <6.0  BUN 13   12 R  15 R 17 R   Creatinine, Ser 1.02   1.04 R  0.98 1.11 R   eGFR 95     101    BUN/Creatinine Ratio 13     15    Sodium 143   138 R  134 137 R    Potassium 4.4   4.4 R  4.3 4.7 R   Chloride 101   101 R  98 102 R   Calcium  9.5   9.2 R  9.2 9.5 R   Phosphorus 3.4     2.1 Low     Total Protein 7.5   7.1 R  7.1 7.7 R   Albumin 4.2   4.4 R  4.3 4.5 R   Globulin, Total 3.3     2.8    Bilirubin Total 0.8   0.5 R  0.5 0.6 R   Alkaline Phosphatase 83   67 R  70 68 R   LDH 129     137    AST 30  23 R  28 23 R   ALT 25   23 R  33 27 R   GGT 17     21    Iron 195 High      171 High     Cholesterol, Total 235 High      229 High     Triglycerides 221 High     310.0 High  R, CM 298 High   285.0 High  R, CM  HDL 42    33.10 Low  R 36 Low   36.50 Low  R  VLDL Cholesterol Cal 41 High      54 High     LDL Chol Calc (NIH) 152 High      139 High     Chol/HDL Ratio 5.6 High     7 R, CM 6.4 High  CM  6 R, CM  Comment:                                   T. Chol/HDL Ratio                                             Men  Women                               1/2 Avg.Risk  3.4    3.3                                   Avg.Risk  5.0    4.4                                2X Avg.Risk  9.6    7.1                                3X Avg.Risk 23.4   11.0  Estimated CHD Risk 1.2 High      1.3 High  CM    Comment: The CHD Risk is based on the T. Chol/HDL ratio. Other factors affect CHD Risk such as hypertension, smoking, diabetes, severe obesity, and family history of premature CHD.  TSH 1.360  1.70 R   1.570    T4, Total 7.5     6.7    T3 Uptake Ratio 30     29    Free Thyroxine Index 2.3     1.9    Prostate Specific Ag, Serum 0.6         Comment: Roche ECLIA methodology. According to the American Urological Association, Serum PSA should decrease and remain at undetectable levels after radical prostatectomy. The AUA defines biochemical recurrence as an initial PSA value 0.2 ng/mL or greater followed by a subsequent confirmatory PSA value 0.2 ng/mL or greater. Values obtained with different assay methods or kits cannot be used interchangeably. Results  cannot be interpreted as absolute evidence of the presence or absence of malignant disease.  WBC 4.7 10.4 R    5.0    RBC 5.17 5.08 R    4.61    Hemoglobin 16.0 16.0  R    14.4    Hematocrit 48.7 47.8 R    41.6    MCV 94 93.9 R    90    MCH 30.9     31.2    MCHC 32.9 33.6 R    34.6    RDW 12.5 12.9 R    12.6    Platelets 166 190.0 R    161    Neutrophils 50 65.6 R    52    Lymphs 39     36    Monocytes 7     9    Eos 3     2    Basos 1     1    Neutrophils Absolute 2.4 6.8 R    2.6    Lymphocytes Absolute 1.9 2.4 R    1.8    Monocytes Absolute 0.3     0.4    EOS (ABSOLUTE) 0.1     0.1    Basophils Absolute 0.0 0.1 R    0.0    Immature Granulocytes 0     0    Immature Grans (Abs) 0.0     0.0                     ____________________________________________  EKG  Sinus bradycardia 58 bpm ____________________________________________    ____________________________________________   INITIAL IMPRESSION / ASSESSMENT AND PLAN   As part of my medical decision making, I reviewed the following data within the electronic MEDICAL RECORD NUMBER       No acute findings on physical exam or EKG.  Patient labs showed lipid elevations.  Patient requests 6 to 12 months of increased diet due to medication.      ____________________________________________   FINAL CLINICAL IMPRESSION Well exam   ED Discharge Orders     None        Note:  This document was prepared using Dragon voice recognition software and may include unintentional dictation errors.

## 2023-12-07 ENCOUNTER — Ambulatory Visit: Admitting: Family Medicine
